# Patient Record
Sex: Female | Born: 1965 | Hispanic: Yes | Marital: Married | Smoking: Never smoker
Health system: Southern US, Community
[De-identification: ages and names within clinical notes are randomized; demographics above are authoritative.]

## PROBLEM LIST (undated history)

## (undated) DIAGNOSIS — I10 Essential (primary) hypertension: Secondary | ICD-10-CM

## (undated) DIAGNOSIS — M75111 Incomplete rotator cuff tear or rupture of right shoulder, not specified as traumatic: Secondary | ICD-10-CM

## (undated) HISTORY — DX: Essential (primary) hypertension: I10

---

## 2006-08-31 ENCOUNTER — Ambulatory Visit: Payer: Self-pay

## 2006-09-06 ENCOUNTER — Ambulatory Visit: Payer: Self-pay | Admitting: *Deleted

## 2006-12-16 ENCOUNTER — Ambulatory Visit: Payer: Self-pay | Admitting: Family Medicine

## 2007-07-27 ENCOUNTER — Ambulatory Visit: Payer: Self-pay | Admitting: Family Medicine

## 2007-10-03 ENCOUNTER — Ambulatory Visit: Payer: Self-pay | Admitting: *Deleted

## 2008-03-01 ENCOUNTER — Observation Stay: Payer: Self-pay

## 2008-04-11 ENCOUNTER — Inpatient Hospital Stay: Payer: Self-pay | Admitting: Obstetrics and Gynecology

## 2008-04-23 ENCOUNTER — Emergency Department: Payer: Self-pay | Admitting: Emergency Medicine

## 2009-12-25 ENCOUNTER — Ambulatory Visit: Payer: Self-pay

## 2010-01-12 ENCOUNTER — Ambulatory Visit: Payer: Self-pay

## 2010-06-02 ENCOUNTER — Ambulatory Visit: Payer: Self-pay

## 2011-06-08 ENCOUNTER — Ambulatory Visit: Payer: Self-pay

## 2011-07-01 ENCOUNTER — Ambulatory Visit: Payer: Self-pay

## 2011-07-01 LAB — URINALYSIS, COMPLETE
Nitrite: NEGATIVE
Ph: 6.5 (ref 4.5–8.0)
Protein: NEGATIVE

## 2011-07-02 LAB — URINE CULTURE

## 2011-07-30 ENCOUNTER — Ambulatory Visit: Payer: Self-pay | Admitting: Internal Medicine

## 2011-08-01 LAB — BETA STREP CULTURE(ARMC)

## 2012-06-14 ENCOUNTER — Ambulatory Visit: Payer: Self-pay

## 2014-06-13 ENCOUNTER — Ambulatory Visit: Payer: Self-pay

## 2015-12-19 ENCOUNTER — Encounter: Payer: Self-pay | Admitting: Physician Assistant

## 2015-12-19 ENCOUNTER — Ambulatory Visit: Payer: Self-pay | Admitting: Physician Assistant

## 2015-12-19 VITALS — BP 124/82 | HR 72 | Temp 98.8°F

## 2015-12-19 DIAGNOSIS — L259 Unspecified contact dermatitis, unspecified cause: Secondary | ICD-10-CM

## 2015-12-19 MED ORDER — HYDROCORTISONE VALERATE 0.2 % EX OINT: 1.0000 "application " | TOPICAL_OINTMENT | Freq: Two times a day (BID) | CUTANEOUS | Status: DC

## 2015-12-19 MED ORDER — HYDROXYZINE HCL 50 MG PO TABS: 50.0000 mg | ORAL_TABLET | Freq: Three times a day (TID) | ORAL | Status: DC | PRN

## 2015-12-19 MED ORDER — METHYLPREDNISOLONE 4 MG PO TBPK: ORAL_TABLET | ORAL | Status: DC

## 2015-12-19 NOTE — Progress Notes (Signed)
   Subjective:Rash    Patient ID: Rachel Hinton, female    DOB: Jul 26, 1965, 50 y.o.   MRN: [id number]  HPI Patient c/o rash anterior neck for 2 weeks. Denies wearing necklace or new personal hygiene products. States mild itching. No palliative measure for compliant.    Review of Systems Negative except for compliant.    Objective:   Physical Exam VSS. Macular lesion bilateral neck radiating to upper chest. No posterior lesion.no s/s of 2nd infection.       Assessment & Plan:Contact Dermatitis   Medrol dose pack, Westcort, and Atarax for one week.  Follow up one week if no improvement. Advised to take picture of Rash before starting medication.

## 2016-03-24 ENCOUNTER — Ambulatory Visit
Admission: RE | Admit: 2016-03-24 | Discharge: 2016-03-24 | Disposition: A | Discharge status: Home / Self Care | Payer: PRIVATE HEALTH INSURANCE | Source: Ambulatory Visit | Attending: Family | Admitting: Family

## 2016-03-24 ENCOUNTER — Ambulatory Visit: Payer: Self-pay | Admitting: Physician Assistant

## 2016-03-24 ENCOUNTER — Other Ambulatory Visit: Payer: Self-pay | Admitting: Family

## 2016-03-24 DIAGNOSIS — M25511 Pain in right shoulder: Secondary | ICD-10-CM | POA: Insufficient documentation

## 2016-03-24 DIAGNOSIS — R52 Pain, unspecified: Secondary | ICD-10-CM

## 2016-04-14 ENCOUNTER — Encounter: Payer: Self-pay | Admitting: Physical Therapy

## 2016-04-14 ENCOUNTER — Ambulatory Visit: Payer: PRIVATE HEALTH INSURANCE | Attending: Family | Admitting: Physical Therapy

## 2016-04-14 DIAGNOSIS — M62838 Other muscle spasm: Secondary | ICD-10-CM | POA: Insufficient documentation

## 2016-04-14 DIAGNOSIS — M6281 Muscle weakness (generalized): Secondary | ICD-10-CM | POA: Diagnosis present

## 2016-04-14 DIAGNOSIS — M25511 Pain in right shoulder: Secondary | ICD-10-CM | POA: Diagnosis present

## 2016-04-15 NOTE — Therapy (Addendum)
[facility] [address], [place], [address] Phone: [phone]   Fax:  [phone]  Physical Therapy Evaluation  Patient Details  Name: Rachel Hinton MRN: [id number] Date of Birth: 1966-02-25 Referring Provider: Kathrine Haddock MD  Encounter Date: 04/14/2016      PT End of Session - 04/14/16 0953    Visit Number 1   Number of Visits 6   Date for PT Re-Evaluation 05/27/16   Authorization Type 1   Authorization Time Period 6 WC   PT Start Time 6962   PT Stop Time 0950   PT Time Calculation (min) 58 min   Activity Tolerance Patient tolerated treatment well   Behavior During Therapy [facility] for tasks assessed/performed      History reviewed. No pertinent past medical history.  History reviewed. No pertinent surgical history.  There were no vitals filed for this visit.       Subjective Assessment - 04/14/16 0905    Subjective She reports currently she is improving with less right shoulder pain and able to perform more activities with less difficulty: Still having pain with reaching across to opposite shoulder, overhead, above shoulder level and behind back . She also reports that she has noticed increased swelling in her right shoulder/upper trapezius and chest area within the past couple of weeks, and increased pain with corner stretch which she was given by MD.    Pertinent History Patient reports she injured right shoulder with exercises in the past and she was seen by chiropractor at that thime and had injection and pain resolved completely.  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 She noticed that after several hours she was beginning to have problems with cleaning mirrors (above shoulder level) and turning on/off faucettes (reaching and rotation of  shoulder). The next day she continued with pain and she was seen by MD on that tuesday and was put in a sling, conservative care with medication, rest, light duty at work.    Limitations Lifting;House hold activities;Other (comment)  raising arm overhead, behind back (personal care, dressing, hair care)   Diagnostic tests X rays right shoulder   Patient Stated Goals To be able to use right arm without pain and back to normal activity   Currently in Pain? Yes   Pain Score 1    Pain Location Shoulder   Pain Orientation Right   Pain Descriptors / Indicators Discomfort;Dull   Pain Type Acute pain  03/21/2016   Pain Onset 1 to 4 weeks ago   Pain Frequency Intermittent   Aggravating Factors  reaching overhead, sleeping on right side, reaching behind back   Pain Relieving Factors hot showers, medication initially   Effect of Pain on Daily Activities limits hair care, personal care (undressing upper body), pushing and pulling, hygiene            [facility] PT Assessment - 04/14/16 0859      Assessment   Medical Diagnosis Right shoulder pain, strain   Referring Provider Kathrine Haddock MD   Onset Date/Surgical Date 03/21/16   Hand Dominance Right   Next MD Visit 04/20/2016   Prior Therapy none     Precautions   Precautions Shoulder   Type of Shoulder Precautions no lifting over 2#, no raising arm overhead for cleaning, no pushing/pulling (like  vacuuming)     Restrictions   Weight Bearing Restrictions No     Balance Screen   Has the patient fallen in the past 6 months No   Has the patient had a decrease in activity level because of a fear of falling?  No   Is the patient reluctant to leave their home because of a fear of falling?  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      Cognition   Overall Cognitive Status Within Functional Limits for tasks assessed     Objective: Posture: Forward rounded shoulders, right shoulder elevated as compared to left Observation: + mild swelling right anterior shoulder, inferior to clavicle AROM: Cervical spine: flexion, extension, rotations WFL without reproduction of symptoms Right shoulder: WFL's forward elevation, abduction and rotations with pain reported above shoulder level and behind back Left shoulder: all planes WNL's without pain Strength: grossly strength right and left UE major muscle groups strong without reproduction of symptoms Palpation; + spasms with tenderness along right shoulder anterior aspect inferior to clavicle along pectoral muscles and right upper trapezius muscle Special tests: + impingement sign right shoulder Outcome Measure: Quick Dash: 44% (moderate self perceived disability) ; work module 25% (mild self perceived disability: with assistance of Cabin crew)  Treatment: Manual therapy: STM performed superficial techniques to right shoulder, upper trapezius and pectoral muscles with patient in seated position Therapeutic exercise: Therapeutic exercise: patient performed exercises with verbal, tactile cues  demonstration of therapist: Sitting/standing: Scapular retractions at doorway with cervical retraction x 10 reps Pendulums for pain control  Patient response to treatment: decreased pain with improved flexibility in right shoulder s/p STM with decreased hiking right shoulder and improved scapular control, mobility; verbalized good understanding of home program        PT Education - 04/14/16 0945    Education provided Yes   Education Details HEP: posture awareness, scapular retraction, pendulums   Person(s) Educated Patient   Methods  Explanation;Demonstration;Verbal cues   Comprehension Verbalized understanding;Returned demonstration;Verbal cues required             PT Long Term Goals - 04/14/16 1050      PT LONG TERM GOAL #1   Title Patient will demonstrate improved function with right UE for daily tasks as indicated by QuickDash score of 20% or less by 05/27/2016   Baseline current 44%   Status New     PT LONG TERM GOAL #2   Title Patient will demonstrate full pain free AROM for overhead and behind right UE use for personal care and work related activities by 05/27/16   Baseline Limited pain free ROM right shoulder flexion and IR behind back    Status New     PT LONG TERM GOAL #3   Title Patient will be independent with home program for pain control, exercise for right UE to allow self management once discharged from physical therapy by 05/27/16   Baseline limited knowledge of appropriate exercises, pain control strategies and progression    Status New  Plan - 04/14/16 1050    Clinical Impression Statement Patient is a 50 year old right hand dominant female with right shoulder pain s/p injury sustained at work 03/21/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 She reports she is improving since injury and continues to have pain with reaching across trunk, above shoulder level and with reaching behind back. She has functional limitaitons with personal care, household cleaning that includes lifting, pushing activities. Her QuickDash score is 44% indicatiing moderate self perceived disability for using right UE for daily tasks and 25% self perceived disabiltiy for work related tasks because she has someone assisting her with duties currently. She has + swelling in right side of chest/inferior to clavicle and spasms in pectoral muscles. She has limited knowledge of pain control strategies and appropriate progression of exercise to return to PLOF and will therefore benefit from physical therapy  intervention.    Rehab Potential Good   Clinical Impairments Affecting Rehab Potential (+)motivated, acute condition, PLOF   PT Frequency --  1-2x/week   PT Duration Other (comment)   PT Treatment/Interventions Electrical Stimulation;Cryotherapy;Moist Heat;Ultrasound;Patient/family education;Neuromuscular re-education;Therapeutic exercise;Manual techniques;Passive range of motion;Dry needling   PT Next Visit Plan pain control, manual techniques for spasms, ROM, therapeutic exercises   PT Home Exercise Plan posture correction, scapular retraction, pendulums, use of heat/ice   Consulted and Agree with Plan of Care Patient      Patient will benefit from skilled therapeutic intervention in order to improve the following deficits and impairments:  Decreased strength, Impaired flexibility, Pain, Impaired perceived functional ability, Decreased activity tolerance, Increased muscle spasms, Impaired UE functional use, Decreased range of motion  Visit Diagnosis: Acute pain of right shoulder - Plan: PT plan of care cert/re-cert  Other muscle spasm - Plan: PT plan of care cert/re-cert  Muscle weakness (generalized) - Plan: PT plan of care cert/re-cert     Problem List There are no active problems to display for this patient.   Beacher MayBrooks, Cheick Suhr PT 04/15/2016, 3:47 PM  [facility] [address] [place], [place], [address] Phone: [phone]   Fax:  [phone]  Name: Sherrine Mapleslejandra Wadley MRN: [id number] Date of Birth: [birth year]

## 2016-04-21 ENCOUNTER — Encounter: Payer: Self-pay | Admitting: Physical Therapy

## 2016-04-21 ENCOUNTER — Ambulatory Visit: Payer: PRIVATE HEALTH INSURANCE | Admitting: Physical Therapy

## 2016-04-21 DIAGNOSIS — M25511 Pain in right shoulder: Secondary | ICD-10-CM | POA: Diagnosis not present

## 2016-04-21 DIAGNOSIS — M6281 Muscle weakness (generalized): Secondary | ICD-10-CM

## 2016-04-21 DIAGNOSIS — M62838 Other muscle spasm: Secondary | ICD-10-CM

## 2016-04-22 NOTE — Therapy (Signed)
[facility] [address] [place], [place], [address] Phone: [phone]   Fax:  [phone]  Physical Therapy Treatment  Patient Details  Name: Rachel Hinton MRN: [id number] Date of Birth: Jun 21, 1966 Referring Provider: Kathrine HaddockHunt, Mary Ruth MD  Encounter Date: 04/21/2016      PT End of Session - 04/21/16 1634    Visit Number 2   Number of Visits 6   Date for PT Re-Evaluation 05/27/16   Authorization Type 2   Authorization Time Period 6 WC   PT Start Time 1625   PT Stop Time 1710   PT Time Calculation (min) 45 min   Activity Tolerance Patient tolerated treatment well   Behavior During Therapy [facility] for tasks assessed/performed      History reviewed. No pertinent past medical history.  History reviewed. No pertinent surgical history.  There were no vitals filed for this visit.      Subjective Assessment - 04/21/16 1630    Subjective Patient reports she is much better with decreasing pain in right shoulder. She is still able to perform pulling activities with less pain/difficulty. Still anxious about pushing activities because she is afraid of causing shoulder pain especially with vacuuming.    Limitations Lifting;House hold activities;Other (comment)  raising arm overhead, pushing activities, personal care   Patient Stated Goals To be able to use right arm without pain and back to normal activity   Currently in Pain? No/denies  no pain: decribes symptoms as tired in right shoulder anterior aspect     Objective; Posture: improved with decreased hiking right shoulder, decreased swelling anterior aspect right shoulder near clavicle Palpation: + spasms along right upper trapezius, decreased soft tissue elasticity right pectoral muscles  Treatment: Manual therapy: STM performed superficial techniques to right shoulder, upper trapezius and pectoral muscles with patient in supine position Therapeutic exercise:  Therapeutic exercise: patient performed exercises with verbal, tactile cues  demonstration of therapist: supine lying:  AAROM right shoulder flexion/ER/IR x 5 reps through full ROM without increased pain; reported feeling stiff at end ranges Isometric flexion, rotations x 10 reps 5 second holds Scapular retraction x 10 Pectoral stretches with LTR to opposite side 3 x 20 seconds with assistance for guiding through full ROM Sitting/standing: Scapular retractions at doorway with cervical retraction x 10 reps Isometric shoulder flexion, extension, rotations performed following demonstration: 10 reps with 5 second holds each plane with VC for correct alignment  Modalities: moist heat applied to right shoulder with patient seated in chair with right UE supported on pillow 5-10 min at end of session for pain control following exercises: no adverse reaction noted   Patient response to treatment: Patient demonstrated improved technique with exercises with moderate repeated demonstration and VC for correct alignment of shoulder/trunk. Patient with decreased spasms by 50% following STM. Improved motor control with repetition        PT Education - 04/21/16 1710    Education provided Yes   Education Details HEP; added isometric exercises at wall/door frame for shoulder extension, flexion, rotations x 10 reps , 5 second holds, pectoral stretch in supine, rotate hips opposite side   Person(s) Educated Patient   Methods Explanation;Demonstration;Verbal cues;Handout   Comprehension Verbalized understanding;Returned demonstration;Verbal cues required             PT Long Term Goals - 04/14/16 1050      PT LONG TERM GOAL #1   Title Patient will demonstrate improved function with right UE for daily  tasks as indicated by QuickDash score of 20% or less by 05/27/2016   Baseline current 44%   Status New     PT LONG TERM GOAL #2   Title Patient will demonstrate full pain free AROM for overhead and  behind right UE use for personal care and work related activities by 05/27/16   Baseline Limited pain free ROM right shoulder flexion and IR behind back    Status New     PT LONG TERM GOAL #3   Title Patient will be independent with home program for pain control, exercise for right UE to allow self management once discharged from physical therapy by 05/27/16   Baseline limited knowledge of appropriate exercises, pain control strategies and progression    Status New               Plan - 04/21/16 1712    Clinical Impression Statement Patient is progressing with strengthening with isometrics today. She did not have any increased pain and demonstrated improved flexibility with pectoral stretches following STM. She continues with decreased strength/endurance for daily tasks and will benefit from additional physical therapy intervention to address limitations so she may be able to return to PLOF   Rehab Potential Good   PT Frequency --  1-2x/week   PT Duration Other (comment)  6 visits   PT Treatment/Interventions Electrical Stimulation;Cryotherapy;Moist Heat;Ultrasound;Patient/family education;Neuromuscular re-education;Therapeutic exercise;Manual techniques;Passive range of motion;Dry needling   PT Next Visit Plan pain control, manual techniques for spasms, ROM, therapeutic exercises   PT Home Exercise Plan posture correction, scapular retraction, pendulums, use of heat/ice      Patient will benefit from skilled therapeutic intervention in order to improve the following deficits and impairments:  Decreased strength, Impaired flexibility, Pain, Impaired perceived functional ability, Decreased activity tolerance, Increased muscle spasms, Impaired UE functional use, Decreased range of motion  Visit Diagnosis: Acute pain of right shoulder  Other muscle spasm  Muscle weakness (generalized)     Problem List There are no active problems to display for this patient.   Beacher May  PT 04/22/2016, 8:25 PM  [place] [facility] PHYSICAL AND SPORTS MEDICINE [address], [place], [address] Phone: [phone]   Fax:  [phone]  Name: Rachel Hinton MRN: [id number] Date of Birth: 1965/08/09

## 2016-04-23 ENCOUNTER — Encounter: Payer: Self-pay | Admitting: Physical Therapy

## 2016-04-23 ENCOUNTER — Ambulatory Visit: Payer: PRIVATE HEALTH INSURANCE | Admitting: Physical Therapy

## 2016-04-23 DIAGNOSIS — M25511 Pain in right shoulder: Secondary | ICD-10-CM

## 2016-04-23 DIAGNOSIS — M6281 Muscle weakness (generalized): Secondary | ICD-10-CM

## 2016-04-23 DIAGNOSIS — M62838 Other muscle spasm: Secondary | ICD-10-CM

## 2016-04-23 NOTE — Therapy (Signed)
[facility] [place] [facility] [address] [place], [place], [address] Phone: [phone]   Fax:  [phone]  Physical Therapy Treatment  Patient Details  Name: Rachel Hinton MRN: [id number] Date of Birth: 1965/07/22 Referring Provider: Kathrine HaddockHunt, Mary Ruth MD  Encounter Date: 04/23/2016      PT End of Session - 04/23/16 0847    Visit Number 3   Number of Visits 6   Date for PT Re-Evaluation 05/27/16   Authorization Type 3   Authorization Time Period 6 WC   PT Start Time 0843   PT Stop Time 0925   PT Time Calculation (min) 42 min   Activity Tolerance Patient tolerated treatment well   Behavior During Therapy [facility] for tasks assessed/performed      History reviewed. No pertinent past medical history.  History reviewed. No pertinent surgical history.  There were no vitals filed for this visit.      Subjective Assessment - 04/23/16 0845    Subjective Patient reports she is able to perform exercises given last session (isometrics) without pain in right shoulder.    Limitations Lifting;House hold activities;Other (comment)   Patient Stated Goals To be able to use right arm without pain and back to normal activity   Currently in Pain? No/denies      Objective; Posture: improved with decreased hiking right shoulder, decreased swelling anterior aspect right shoulder near clavicle Palpation: right pectoral muscle with mild spasm, + crepitus/grinding noted intermittently in [place] joint right shoulder with shoulder elevation and shoulder flexion  Treatment: Manual therapy: STM performed superficial techniques to right shoulder, upper trapezius and pectoral muscles with patient in supine position Therapeutic exercise: Therapeutic exercise: patient performed exercises with verbal, tactile cues demonstration of therapist: supine lying:  AAROM right shoulder flexion/ER/IR x 5 reps through full ROM without increased pain  Rhythmic  stabilization with Isometric flexion, rotations x 10 reps Pectoral stretches with LTR to opposite side 3 x 20 seconds with assistance for guiding through full ROM Side lying: Towel under arm: ER with 2# dumbbell x 10 reps Shoulder elevation/depression x 10 through partial elevation due to increased grinding/popping in [place] joint Scapular retraction x 5 (limited due to [facility] joint popping/grinding  Sitting/standing: Isometric shoulder flexion, extension, rotations performed x 10 reps with 5 second holds each plane   Modalities: moist heat applied to right shoulder with patient seated in chair with right UE supported on pillow 5-10 min at end of session for pain control following exercises: no adverse reaction noted  Patient response to treatment: patient demonstrated improved technique with exercises with minimal VC for correct alignment. Limited exercises due to increased popping and grinding in [place] joint intermittently with shoulder elevation and scapular retraction. None noted with isometric exercises.          PT Education - 04/23/16 0846    Education provided Yes   Education Details HEP: re assessed home program for isometric exercises and pectoral stretch, instructed to monitor symptoms in front of shoulder, [place] joint   Person(s) Educated Patient   Methods Explanation;Verbal cues   Comprehension Verbalized understanding;Returned demonstration;Verbal cues required             PT Long Term Goals - 04/14/16 1050      PT LONG TERM GOAL #1   Title Patient will demonstrate improved function with right UE for daily tasks as indicated by QuickDash score of 20% or less by 05/27/2016   Baseline current 44%   Status New  PT LONG TERM GOAL #2   Title Patient will demonstrate full pain free AROM for overhead and behind right UE use for personal care and work related activities by 05/27/16   Baseline Limited pain free ROM right shoulder flexion and IR behind back    Status New      PT LONG TERM GOAL #3   Title Patient will be independent with home program for pain control, exercise for right UE to allow self management once discharged from physical therapy by 05/27/16   Baseline limited knowledge of appropriate exercises, pain control strategies and progression    Status New               Plan - 04/23/16 0930    Clinical Impression Statement Patient demonstrates improving ROM and continues with discomfort anterior aspect of right shoulder girdle with grinding at [facility] joint which may be due to inflammation. This may improve with continued strengthening and soft tissue mobilization of pectoral and upper trapezius muslces. Will monitor symptoms for improvement.    Rehab Potential Good   PT Frequency Other (comment)  1-2x/week   PT Duration Other (comment)  6 visits   PT Treatment/Interventions Electrical Stimulation;Cryotherapy;Moist Heat;Ultrasound;Patient/family education;Neuromuscular re-education;Therapeutic exercise;Manual techniques;Passive range of motion;Dry needling   PT Next Visit Plan pain control, manual techniques for spasms, ROM, therapeutic exercises   PT Home Exercise Plan posture correction, scapular retraction, pendulums, use of heat/ice      Patient will benefit from skilled therapeutic intervention in order to improve the following deficits and impairments:  Decreased strength, Impaired flexibility, Pain, Impaired perceived functional ability, Decreased activity tolerance, Increased muscle spasms, Impaired UE functional use, Decreased range of motion  Visit Diagnosis: Acute pain of right shoulder  Other muscle spasm  Muscle weakness (generalized)     Problem List There are no active problems to display for this patient.   Beacher May PT 04/23/2016, 12:53 PM  [place] [facility] [address], [place], [address] Phone: [phone]   Fax:  [phone]  Name:  Rachel Hinton MRN: [id number] Date of Birth: 1965/09/11

## 2016-04-28 ENCOUNTER — Encounter: Payer: Self-pay | Admitting: Physical Therapy

## 2016-04-28 ENCOUNTER — Ambulatory Visit: Payer: PRIVATE HEALTH INSURANCE | Attending: Family | Admitting: Physical Therapy

## 2016-04-28 DIAGNOSIS — M62838 Other muscle spasm: Secondary | ICD-10-CM | POA: Diagnosis present

## 2016-04-28 DIAGNOSIS — M25511 Pain in right shoulder: Secondary | ICD-10-CM | POA: Insufficient documentation

## 2016-04-28 DIAGNOSIS — M6281 Muscle weakness (generalized): Secondary | ICD-10-CM | POA: Diagnosis present

## 2016-04-28 NOTE — Therapy (Signed)
[facility] [address] [place], [place], [address] Phone: [phone]   Fax:  [phone]  Physical Therapy Treatment  Patient Details  Name: Rachel Mapleslejandra Montanye MRN: [id number] Date of Birth: [birth year] Referring Provider: Kathrine HaddockHunt, Mary Ruth MD  Encounter Date: 04/28/2016      PT End of Session - 04/28/16 1924    Visit Number 4   Number of Visits 6   Date for PT Re-Evaluation 05/27/16   Authorization Type 4   Authorization Time Period 6 WC   PT Start Time 1831   PT Stop Time 1915   PT Time Calculation (min) 44 min   Activity Tolerance Patient tolerated treatment well   Behavior During Therapy [facility] for tasks assessed/performed      History reviewed. No pertinent past medical history.  History reviewed. No pertinent surgical history.  There were no vitals filed for this visit.      Subjective Assessment - 04/28/16 1832    Subjective Patient reports she is feeling better with less pain in right shoulder. She is still having some popping in [place] joint in front of chest. She is having a feeling some weakness in shoulder.She is now able to place right hand behind back with less difficulty and unhook bra with mild discomfort; she can put hair in ponytail now with mild discomfort now where she could not do it at all when she began PT.    Limitations Lifting;House hold activities;Other (comment)   Patient Stated Goals To be able to use right arm without pain and back to normal activity   Currently in Pain? Yes   Pain Score 2    Pain Location Shoulder   Pain Orientation Right;Anterior   Pain Descriptors / Indicators Discomfort   Pain Type Acute pain  03/21/2016   Pain Onset More than a month ago   Pain Frequency Intermittent      Objective; Posture: improved with decreased hiking right shoulder, decreased swelling anterior aspect right shoulder near clavicle Palpation: right pectoral muscle with mild spasm, + swelling  over right [place] joint as compared to left, + spasms SCM and scalenes right   Treatment: Manual therapy: STM performed superficial techniques to right shoulder, upper trapezius and pectoral muscles with patient in supine position; scalenes, SCM with patient seated Therapeutic exercise: Therapeutic exercise: patient performed exercises with verbal, tactile cues demonstration of therapist: supine lying:  AAROM right shoulder flexion/ER/IR x 5 reps through full ROM without increased pain  Rhythmic stabilization with Isometric flexion, rotations x 10 reps  Sitting/standing: Ball on wall for shoulder stabilization x 10 reps rotations and flexion [place]ene and upper trapezius stretch x 3 reps 10 second holds  Modalities: moist heat applied to right shoulder with patient seated in chair with right UE supported on pillow 5 min at end of session for pain control following exercises: no adverse reaction noted  Patient response to treatment: Patient demonstrated improved technique with exercises with minimal VC for correct alignment. Patient with mild increased  Soreness in right side chest/SCM. Patient with decreased spasms by 50% following STM.         PT Education - 04/28/16 1923    Education provided Yes   Education Details HEP: added scapular rows standing and shoulder extension standing with green tubing 10 reps 1x/day; stretch scalenes in sitting, gentle stretch   Person(s) Educated Patient   Methods Explanation;Demonstration;Verbal cues;Handout   Comprehension Verbalized understanding;Returned demonstration;Verbal cues required  PT Long Term Goals - 04/14/16 1050      PT LONG TERM GOAL #1   Title Patient will demonstrate improved function with right UE for daily tasks as indicated by QuickDash score of 20% or less by 05/27/2016   Baseline current 44%   Status New     PT LONG TERM GOAL #2   Title Patient will demonstrate full pain free AROM for overhead and behind  right UE use for personal care and work related activities by 05/27/16   Baseline Limited pain free ROM right shoulder flexion and IR behind back    Status New     PT LONG TERM GOAL #3   Title Patient will be independent with home program for pain control, exercise for right UE to allow self management once discharged from physical therapy by 05/27/16   Baseline limited knowledge of appropriate exercises, pain control strategies and progression    Status New               Plan - 04/28/16 1930    Clinical Impression Statement Patient with decreasing swelling, improving strength and control right hsoulder with current treatment. progressing toward all goals.    Rehab Potential Good   PT Frequency Other (comment)  1-2x/weel   PT Treatment/Interventions Electrical Stimulation;Cryotherapy;Moist Heat;Ultrasound;Patient/family education;Neuromuscular re-education;Therapeutic exercise;Manual techniques;Passive range of motion;Dry needling   PT Next Visit Plan pain control, manual techniques for spasms, ROM, therapeutic exercises   PT Home Exercise Plan posture correction, scapular retraction, pendulums, use of heat/ice      Patient will benefit from skilled therapeutic intervention in order to improve the following deficits and impairments:  Decreased strength, Impaired flexibility, Pain, Impaired perceived functional ability, Decreased activity tolerance, Increased muscle spasms, Impaired UE functional use, Decreased range of motion  Visit Diagnosis: Acute pain of right shoulder  Other muscle spasm     Problem List There are no active problems to display for this patient.   Beacher MayBrooks, Jordyn Hofacker PT 04/29/2016, 8:37 PM  [facility] [address] [place], [place], [address] Phone: [phone]   Fax:  [phone]  Name: Rachel Mapleslejandra Poyser MRN: [id number] Date of Birth: [birth year]

## 2016-05-04 ENCOUNTER — Ambulatory Visit: Payer: PRIVATE HEALTH INSURANCE | Admitting: Physical Therapy

## 2016-05-04 ENCOUNTER — Encounter: Payer: Self-pay | Admitting: Physical Therapy

## 2016-05-04 DIAGNOSIS — M6281 Muscle weakness (generalized): Secondary | ICD-10-CM

## 2016-05-04 DIAGNOSIS — M62838 Other muscle spasm: Secondary | ICD-10-CM

## 2016-05-04 DIAGNOSIS — M25511 Pain in right shoulder: Secondary | ICD-10-CM | POA: Diagnosis not present

## 2016-05-04 NOTE — Therapy (Signed)
[facility] [address] [place], [place], [address] Phone: [phone]   Fax:  [phone]  Physical Therapy Treatment  Patient Details  Name: Rachel Hinton MRN: [id number] Date of Birth: 1966/01/29 Referring Provider: Kathrine HaddockHunt, Mary Ruth MD; Edmonia Jamesommie Ann Moore, NP  Encounter Date: 05/04/2016      PT End of Session - 05/04/16 1957    Visit Number 5   Number of Visits 6   Date for PT Re-Evaluation 05/27/16   Authorization Type 5   Authorization Time Period 6 WC   PT Start Time 1903   PT Stop Time 1945   PT Time Calculation (min) 42 min   Activity Tolerance Patient tolerated treatment well   Behavior During Therapy [facility] for tasks assessed/performed      History reviewed. No pertinent past medical history.  History reviewed. No pertinent surgical history.  There were no vitals filed for this visit.      Subjective Assessment - 05/04/16 1909    Subjective Since last visit patient reports she is doing more at work with right UE because she is getting back to more normal duties and is now feeling increased burning into lateral upper arm and in front of chest is having tinging/biting sensation. She feels heaviness, weakness in right shoulder.  She is noticing the swelling is coming down in front of chest as well. She is concerned with prominence of joint in front of chest ([place] joint) and cannot perform all duties at work due to right shoulder/ chest discomfort/pain.   Limitations Lifting;House hold activities;Other (comment)   Patient Stated Goals To be able to use right arm without pain and back to normal activity   Currently in Pain? Yes   Pain Score 3    Pain Location Arm   Pain Orientation Right;Lateral   Pain Descriptors / Indicators Burning;Discomfort   Pain Type Acute pain  03/21/2016   Pain Onset More than a month ago      Objective: Posture/observation; + mild swelling with more prominent [place] joint right  than left AAROM: right shoulder full ROM flexion, rotations, abduction without increased pain Strength; right shoulder flexion, rotations, abduction 4/5 with increased anterior shoulder, [place] joint discomfort right shoulder with resisted forward flexion Palpation; + spasms along right pectoral muscles, + more prominent right [place] joint (needs further evaluation for possible sprain)   Treatment: Manual therapy: STM right shoulder anterior shoulder, pectoral muscles, superficial techniques with patient supine Therapeutic exercise: performed with verbal, tactile cues and demonstration of therapist: Supine lying:  AAROM through full ROM right shoulder flexion, ER, IR abduction and pectoral stretch 5 reps each Isometric flexion with elbow flexed 90 degrees x 3 reps 2 sets (pre and post US) Modalities: Ultrasound 1MHz pulsed 20% 1.3w/cm2 x 10 min. Right pectoral region, [place] joint with patient supine lying   Patient response to treatment: decreased prominence of [place] joint with decreased discomfort with resisted flexion right shoulder following US, decreased burning into right shoulder following treatment to no symptoms of burning.       PT Education - 05/04/16 2023    Education provided Yes   Education Details HEP: hold off on exercises until follow up assessment tomorrow   Person(s) Educated Patient   Methods Explanation   Comprehension Verbalized understanding             PT Long Term Goals - 04/14/16 1050      PT LONG TERM GOAL #1   Title Patient  will demonstrate improved function with right UE for daily tasks as indicated by QuickDash score of 20% or less by 05/27/2016   Baseline current 44%   Status New     PT LONG TERM GOAL #2   Title Patient will demonstrate full pain free AROM for overhead and behind right UE use for personal care and work related activities by 05/27/16   Baseline Limited pain free ROM right shoulder flexion and IR behind back    Status New     PT LONG TERM  GOAL #3   Title Patient will be independent with home program for pain control, exercise for right UE to allow self management once discharged from physical therapy by 05/27/16   Baseline limited knowledge of appropriate exercises, pain control strategies and progression    Status New               Plan - 05/04/16 1958    Clinical Impression Statement Patient has improved with ROM to full pain free right shoulder flexion, extension, IR and ER. She continues with anterior chest swelling and sterno clavicular joint more prominent on right side as compared to left with increased pain with isometric flexion/pushing motion right UE. Recommend further diagnostic assessment [place] joint to determine amount of injury/sprain due to continued swelling, pain, burning sensations in chest/right shoulder/upper arm with increased activity.    Rehab Potential Good   PT Frequency Other (comment)  1-2x/week   PT Duration Other (comment)  6 visits   PT Treatment/Interventions Electrical Stimulation;Cryotherapy;Moist Heat;Ultrasound;Patient/family education;Neuromuscular re-education;Therapeutic exercise;Manual techniques;Passive range of motion;Dry needling   PT Next Visit Plan pain control, manual techniques for spasms, ROM, therapeutic exercises   PT Home Exercise Plan posture correction, scapular retraction, pendulums, use of heat/ice      Patient will benefit from skilled therapeutic intervention in order to improve the following deficits and impairments:  Decreased strength, Impaired flexibility, Pain, Impaired perceived functional ability, Decreased activity tolerance, Increased muscle spasms, Impaired UE functional use, Decreased range of motion  Visit Diagnosis: Acute pain of right shoulder  Other muscle spasm  Muscle weakness (generalized)     Problem List There are no active problems to display for this patient.   Beacher MayBrooks, Marie PT 05/04/2016, 8:24 PM  [facility] [address] [place], [place], [address] Phone: [phone]   Fax:  [phone]  Name: Rachel Hinton MRN: [id number] Date of Birth: September 10, 1965

## 2016-05-06 ENCOUNTER — Encounter: Payer: PRIVATE HEALTH INSURANCE | Admitting: Physical Therapy

## 2016-05-13 ENCOUNTER — Encounter: Payer: Self-pay | Admitting: Physical Therapy

## 2016-05-13 ENCOUNTER — Ambulatory Visit: Payer: PRIVATE HEALTH INSURANCE | Admitting: Physical Therapy

## 2016-05-13 DIAGNOSIS — M25511 Pain in right shoulder: Secondary | ICD-10-CM

## 2016-05-13 DIAGNOSIS — M62838 Other muscle spasm: Secondary | ICD-10-CM

## 2016-05-13 DIAGNOSIS — M6281 Muscle weakness (generalized): Secondary | ICD-10-CM

## 2016-05-14 NOTE — Therapy (Signed)
[facility] [address] [place], [place], [address] Phone: [phone]   Fax:  [phone]  Physical Therapy Treatment  Patient Details  Name: Rachel Hinton MRN: [id number] Date of Birth: 11-Apr-1966 Referring Provider: Kathrine HaddockHunt, Mary Ruth MD  Encounter Date: 05/13/2016      PT End of Session - 05/13/16 1538    Visit Number 6   Number of Visits 6   Date for PT Re-Evaluation 05/27/16   Authorization Type 6   Authorization Time Period 6 WC   PT Start Time 1531   PT Stop Time 1613   PT Time Calculation (min) 42 min   Activity Tolerance Patient tolerated treatment well   Behavior During Therapy [facility] for tasks assessed/performed      History reviewed. No pertinent past medical history.  History reviewed. No pertinent surgical history.  There were no vitals filed for this visit.      Subjective Assessment - 05/13/16 1533    Subjective Patient reports she continues with intermittent symptoms of burning into right anterior shoulder and biting sensation around [place] joint. She is still on restrictions for work with no pushing, no lifting.  She is scheduled to have an orthopedic consult next week and will find out if she requires further therapy or diagnotic tests.    Limitations Lifting;House hold activities;Other (comment)   Patient Stated Goals To be able to use right arm without pain and back to normal activity   Currently in Pain? No/denies      Objective: Posture/observation; right shoulder/anterior chest mild swelling with more prominent [place] joint  AAROM: right shoulder full ROM flexion, rotations, abduction without increased pain Strength; right shoulder flexion, rotations, abduction 4/5 with increased anterior shoulder, [place] joint discomfort right shoulder with resisted forward flexion Palpation; + spasms along right pectoral muscles, SCM, cervical paraspinal muscles and upper trapezius, more prominent right [place]  joint (needs further evaluation for possible sprain)   Treatment: Manual therapy: STM right shoulder anterior shoulder, pectoral muscles, SCM and cervical spine paraspinals, superficial techniques with patient supine, followed by ROM exercises Therapeutic exercise: performed with verbal, tactile cues and demonstration of therapist: Supine lying:  AAROM through full ROM right shoulder flexion, ER, IR abduction and pectoral stretch 5 reps each Modalities: Ice pack applied to right shoulder/[place] joint with patient supine lying with pillow supporting right UE x 10 min. At end of session (unbilled)  Patient response to treatment: No increased pain with ROM exercises, decreased spasms noted with STM, [place] joint remained prominent as compared to left joint.         PT Education - 05/13/16 1536    Education provided Yes   Education Details HEP: positioning for sleep using cervical roll; continue with ROM and postureal exercises, pain free.    Person(s) Educated Patient   Methods Explanation   Comprehension Verbalized understanding             PT Long Term Goals - 04/14/16 1050      PT LONG TERM GOAL #1   Title Patient will demonstrate improved function with right UE for daily tasks as indicated by QuickDash score of 20% or less by 05/27/2016   Baseline current 44%   Status New     PT LONG TERM GOAL #2   Title Patient will demonstrate full pain free AROM for overhead and behind right UE use for personal care and work related activities by 05/27/16   Baseline Limited pain free ROM right  shoulder flexion and IR behind back    Status New     PT LONG TERM GOAL #3   Title Patient will be independent with home program for pain control, exercise for right UE to allow self management once discharged from physical therapy by 05/27/16   Baseline limited knowledge of appropriate exercises, pain control strategies and progression    Status New               Plan - 05/13/16 1539     Clinical Impression Statement Patient continues with pain, swelling in right shoulder, chest, [place] joint that limits function with daily tasks, personal care and work related activities. She feels better with resting and using ice or heat, depending on the day.  She is going for orthopedic evaluation to determine further course of treatment.    Rehab Potential Good   PT Frequency Other (comment)  1-2x/week    PT Duration Other (comment)  6 visits   PT Treatment/Interventions Electrical Stimulation;Cryotherapy;Moist Heat;Ultrasound;Patient/family education;Neuromuscular re-education;Therapeutic exercise;Manual techniques;Passive range of motion;Dry needling   PT Next Visit Plan pain control, manual techniques for spasms, ROM, therapeutic exercises   PT Home Exercise Plan posture correction, scapular retraction, pendulums, use of heat/ice      Patient will benefit from skilled therapeutic intervention in order to improve the following deficits and impairments:  Decreased strength, Impaired flexibility, Pain, Impaired perceived functional ability, Decreased activity tolerance, Increased muscle spasms, Impaired UE functional use, Decreased range of motion  Visit Diagnosis: Acute pain of right shoulder  Other muscle spasm  Muscle weakness (generalized)     Problem List There are no active problems to display for this patient.   Beacher MayBrooks, Vincent Ehrler PT 05/14/2016, 9:19 PM  [place] [facility] [address] [place], [place], [address] Phone: [phone]   Fax:  [phone]  Name: Rachel Hinton MRN: [id number] Date of Birth: 11-22-1965

## 2016-05-17 ENCOUNTER — Ambulatory Visit: Payer: Self-pay | Admitting: Physical Therapy

## 2016-05-26 ENCOUNTER — Encounter: Payer: Self-pay | Admitting: Physical Therapy

## 2016-06-14 ENCOUNTER — Other Ambulatory Visit: Payer: Self-pay | Admitting: Obstetrics & Gynecology

## 2016-06-14 DIAGNOSIS — M25511 Pain in right shoulder: Secondary | ICD-10-CM

## 2016-06-25 ENCOUNTER — Ambulatory Visit
Admission: RE | Admit: 2016-06-25 | Discharge: 2016-06-25 | Disposition: A | Discharge status: Home / Self Care | Payer: PRIVATE HEALTH INSURANCE | Source: Ambulatory Visit | Attending: Obstetrics & Gynecology | Admitting: Obstetrics & Gynecology

## 2016-06-25 ENCOUNTER — Encounter: Payer: Self-pay | Admitting: Radiology

## 2016-06-25 DIAGNOSIS — M7551 Bursitis of right shoulder: Secondary | ICD-10-CM | POA: Diagnosis not present

## 2016-06-25 DIAGNOSIS — M25511 Pain in right shoulder: Secondary | ICD-10-CM

## 2016-06-25 DIAGNOSIS — M75101 Unspecified rotator cuff tear or rupture of right shoulder, not specified as traumatic: Secondary | ICD-10-CM | POA: Insufficient documentation

## 2016-06-25 DIAGNOSIS — M67911 Unspecified disorder of synovium and tendon, right shoulder: Secondary | ICD-10-CM | POA: Diagnosis present

## 2016-07-13 ENCOUNTER — Encounter (HOSPITAL_BASED_OUTPATIENT_CLINIC_OR_DEPARTMENT_OTHER): Payer: Self-pay | Admitting: *Deleted

## 2016-07-16 ENCOUNTER — Other Ambulatory Visit: Payer: Self-pay | Admitting: Orthopedic Surgery

## 2016-07-19 ENCOUNTER — Encounter (HOSPITAL_BASED_OUTPATIENT_CLINIC_OR_DEPARTMENT_OTHER): Payer: Self-pay | Admitting: *Deleted

## 2016-07-19 ENCOUNTER — Ambulatory Visit (HOSPITAL_BASED_OUTPATIENT_CLINIC_OR_DEPARTMENT_OTHER): Payer: PRIVATE HEALTH INSURANCE | Admitting: Anesthesiology

## 2016-07-19 ENCOUNTER — Encounter (HOSPITAL_BASED_OUTPATIENT_CLINIC_OR_DEPARTMENT_OTHER)
Admission: RE | Disposition: A | Discharge status: Home / Self Care | Payer: Self-pay | Source: Ambulatory Visit | Attending: Orthopedic Surgery

## 2016-07-19 ENCOUNTER — Ambulatory Visit (HOSPITAL_BASED_OUTPATIENT_CLINIC_OR_DEPARTMENT_OTHER)
Admission: RE | Admit: 2016-07-19 | Discharge: 2016-07-19 | Disposition: A | Discharge status: Home / Self Care | Payer: PRIVATE HEALTH INSURANCE | Source: Ambulatory Visit | Attending: Orthopedic Surgery | Admitting: Orthopedic Surgery

## 2016-07-19 DIAGNOSIS — M75111 Incomplete rotator cuff tear or rupture of right shoulder, not specified as traumatic: Secondary | ICD-10-CM | POA: Diagnosis not present

## 2016-07-19 DIAGNOSIS — Y990 Civilian activity done for income or pay: Secondary | ICD-10-CM | POA: Diagnosis not present

## 2016-07-19 DIAGNOSIS — M249 Joint derangement, unspecified: Secondary | ICD-10-CM | POA: Insufficient documentation

## 2016-07-19 DIAGNOSIS — S43491A Other sprain of right shoulder joint, initial encounter: Secondary | ICD-10-CM | POA: Insufficient documentation

## 2016-07-19 DIAGNOSIS — M75101 Unspecified rotator cuff tear or rupture of right shoulder, not specified as traumatic: Secondary | ICD-10-CM | POA: Diagnosis not present

## 2016-07-19 DIAGNOSIS — M19011 Primary osteoarthritis, right shoulder: Secondary | ICD-10-CM | POA: Diagnosis not present

## 2016-07-19 DIAGNOSIS — G8918 Other acute postprocedural pain: Secondary | ICD-10-CM | POA: Diagnosis not present

## 2016-07-19 DIAGNOSIS — M25511 Pain in right shoulder: Secondary | ICD-10-CM | POA: Diagnosis present

## 2016-07-19 HISTORY — PX: SUBACROMIAL DECOMPRESSION: SHX5174

## 2016-07-19 HISTORY — PX: SHOULDER ARTHROSCOPY WITH DISTAL CLAVICLE RESECTION: SHX5675

## 2016-07-19 HISTORY — DX: Incomplete rotator cuff tear or rupture of right shoulder, not specified as traumatic: M75.111

## 2016-07-19 SURGERY — SHOULDER ARTHROSCOPY WITH DISTAL CLAVICLE RESECTION
Anesthesia: Regional | Site: Shoulder | Laterality: Right

## 2016-07-19 MED ORDER — OXYCODONE-ACETAMINOPHEN 5-325 MG PO TABS: 1.0000 | ORAL_TABLET | ORAL | 0 refills | Status: DC | PRN

## 2016-07-19 MED ORDER — MIDAZOLAM HCL 2 MG/2ML IJ SOLN
1.0000 mg | INTRAMUSCULAR | Status: DC | PRN
  Administered 2016-07-19: 2 mg via INTRAVENOUS

## 2016-07-19 MED ORDER — OXYCODONE HCL 5 MG PO TABS: 5.0000 mg | ORAL_TABLET | Freq: Once | ORAL | Status: DC | PRN

## 2016-07-19 MED ORDER — SCOPOLAMINE 1 MG/3DAYS TD PT72: 1.0000 | MEDICATED_PATCH | Freq: Once | TRANSDERMAL | Status: DC | PRN

## 2016-07-19 MED ORDER — SUCCINYLCHOLINE CHLORIDE 20 MG/ML IJ SOLN
INTRAMUSCULAR | Status: DC | PRN
  Administered 2016-07-19: 80 mg via INTRAVENOUS

## 2016-07-19 MED ORDER — DOCUSATE SODIUM 100 MG PO CAPS: 100.0000 mg | ORAL_CAPSULE | Freq: Three times a day (TID) | ORAL | 0 refills | Status: DC | PRN

## 2016-07-19 MED ORDER — LIDOCAINE HCL (CARDIAC) 20 MG/ML IV SOLN
INTRAVENOUS | Status: DC | PRN
  Administered 2016-07-19: 100 mg via INTRAVENOUS

## 2016-07-19 MED ORDER — DEXAMETHASONE SODIUM PHOSPHATE 4 MG/ML IJ SOLN
INTRAMUSCULAR | Status: DC | PRN
  Administered 2016-07-19: 10 mg via INTRAVENOUS

## 2016-07-19 MED ORDER — ONDANSETRON HCL 4 MG/2ML IJ SOLN
INTRAMUSCULAR | Status: DC | PRN
Start: 2016-07-19 — End: 2016-07-19
  Administered 2016-07-19: 4 mg via INTRAVENOUS

## 2016-07-19 MED ORDER — POVIDONE-IODINE 7.5 % EX SOLN: Freq: Once | CUTANEOUS | Status: DC

## 2016-07-19 MED ORDER — BUPIVACAINE-EPINEPHRINE (PF) 0.5% -1:200000 IJ SOLN
INTRAMUSCULAR | Status: DC | PRN
  Administered 2016-07-19: 30 mL via PERINEURAL

## 2016-07-19 MED ORDER — HYDROMORPHONE HCL 1 MG/ML IJ SOLN: 0.2500 mg | INTRAMUSCULAR | Status: DC | PRN

## 2016-07-19 MED ORDER — OXYCODONE HCL 5 MG/5ML PO SOLN: 5.0000 mg | Freq: Once | ORAL | Status: DC | PRN

## 2016-07-19 MED ORDER — MIDAZOLAM HCL 2 MG/2ML IJ SOLN
INTRAMUSCULAR | Status: AC
  Filled 2016-07-19: qty 2

## 2016-07-19 MED ORDER — LACTATED RINGERS IV SOLN
INTRAVENOUS | Status: DC
  Administered 2016-07-19 (×2): via INTRAVENOUS

## 2016-07-19 MED ORDER — PROMETHAZINE HCL 25 MG/ML IJ SOLN: 6.2500 mg | INTRAMUSCULAR | Status: DC | PRN

## 2016-07-19 MED ORDER — CEFAZOLIN SODIUM-DEXTROSE 2-4 GM/100ML-% IV SOLN
2.0000 g | INTRAVENOUS | Status: AC
  Administered 2016-07-19: 2 g via INTRAVENOUS

## 2016-07-19 MED ORDER — FENTANYL CITRATE (PF) 100 MCG/2ML IJ SOLN
50.0000 ug | INTRAMUSCULAR | Status: DC | PRN
  Administered 2016-07-19: 100 ug via INTRAVENOUS

## 2016-07-19 MED ORDER — CEFAZOLIN SODIUM-DEXTROSE 2-4 GM/100ML-% IV SOLN
INTRAVENOUS | Status: AC
  Filled 2016-07-19: qty 100

## 2016-07-19 MED ORDER — FENTANYL CITRATE (PF) 100 MCG/2ML IJ SOLN
INTRAMUSCULAR | Status: AC
  Filled 2016-07-19: qty 2

## 2016-07-19 MED ORDER — MEPERIDINE HCL 25 MG/ML IJ SOLN: 6.2500 mg | INTRAMUSCULAR | Status: DC | PRN

## 2016-07-19 MED ORDER — PROPOFOL 10 MG/ML IV BOLUS
INTRAVENOUS | Status: DC | PRN
  Administered 2016-07-19: 150 mg via INTRAVENOUS
  Administered 2016-07-19: 50 mg via INTRAVENOUS

## 2016-07-19 SURGICAL SUPPLY — 81 items
BENZOIN TINCTURE PRP APPL 2/3 (GAUZE/BANDAGES/DRESSINGS) IMPLANT
BLADE CLIPPER SURG (BLADE) IMPLANT
BLADE SURG 15 STRL LF DISP TIS (BLADE) IMPLANT
BLADE SURG 15 STRL SS (BLADE)
BUR OVAL 4.0 (BURR) ×4 IMPLANT
CANNULA 5.75X71 LONG (CANNULA) ×4 IMPLANT
CANNULA TWIST IN 8.25X7CM (CANNULA) IMPLANT
CHLORAPREP W/TINT 26ML (MISCELLANEOUS) ×4 IMPLANT
CLOSURE WOUND 1/2 X4 (GAUZE/BANDAGES/DRESSINGS)
DECANTER SPIKE VIAL GLASS SM (MISCELLANEOUS) IMPLANT
DERMABOND ADVANCED (GAUZE/BANDAGES/DRESSINGS)
DERMABOND ADVANCED .7 DNX12 (GAUZE/BANDAGES/DRESSINGS) IMPLANT
DRAPE IMP U-DRAPE 54X76 (DRAPES) ×4 IMPLANT
DRAPE INCISE IOBAN 66X45 STRL (DRAPES) ×4 IMPLANT
DRAPE STERI 35X30 U-POUCH (DRAPES) ×4 IMPLANT
DRAPE SURG 17X23 STRL (DRAPES) ×4 IMPLANT
DRAPE U-SHAPE 47X51 STRL (DRAPES) ×4 IMPLANT
DRAPE U-SHAPE 76X120 STRL (DRAPES) ×8 IMPLANT
DRSG PAD ABDOMINAL 8X10 ST (GAUZE/BANDAGES/DRESSINGS) ×4 IMPLANT
ELECT REM PT RETURN 9FT ADLT (ELECTROSURGICAL) ×4
ELECTRODE REM PT RTRN 9FT ADLT (ELECTROSURGICAL) ×2 IMPLANT
GAUZE SPONGE 4X4 12PLY STRL (GAUZE/BANDAGES/DRESSINGS) ×4 IMPLANT
GAUZE SPONGE 4X4 16PLY XRAY LF (GAUZE/BANDAGES/DRESSINGS) IMPLANT
GAUZE XEROFORM 1X8 LF (GAUZE/BANDAGES/DRESSINGS) ×4 IMPLANT
GLOVE BIO SURGEON STRL SZ7 (GLOVE) ×4 IMPLANT
GLOVE BIO SURGEON STRL SZ7.5 (GLOVE) ×4 IMPLANT
GLOVE BIOGEL PI IND STRL 7.0 (GLOVE) ×6 IMPLANT
GLOVE BIOGEL PI IND STRL 8 (GLOVE) ×2 IMPLANT
GLOVE BIOGEL PI INDICATOR 7.0 (GLOVE) ×6
GLOVE BIOGEL PI INDICATOR 8 (GLOVE) ×2
GLOVE ECLIPSE 6.5 STRL STRAW (GLOVE) ×4 IMPLANT
GOWN STRL REUS W/ TWL LRG LVL3 (GOWN DISPOSABLE) ×4 IMPLANT
GOWN STRL REUS W/ TWL XL LVL3 (GOWN DISPOSABLE) ×2 IMPLANT
GOWN STRL REUS W/TWL LRG LVL3 (GOWN DISPOSABLE) ×4
GOWN STRL REUS W/TWL XL LVL3 (GOWN DISPOSABLE) ×2
LASSO CRESCENT QUICKPASS (SUTURE) IMPLANT
MANIFOLD NEPTUNE II (INSTRUMENTS) ×4 IMPLANT
NDL SUT 6 .5 CRC .975X.05 MAYO (NEEDLE) IMPLANT
NEEDLE 1/2 CIR CATGUT .05X1.09 (NEEDLE) IMPLANT
NEEDLE MAYO TAPER (NEEDLE)
NEEDLE SCORPION MULTI FIRE (NEEDLE) IMPLANT
NS IRRIG 1000ML POUR BTL (IV SOLUTION) IMPLANT
PACK ARTHROSCOPY DSU (CUSTOM PROCEDURE TRAY) ×4 IMPLANT
PACK BASIN DAY SURGERY FS (CUSTOM PROCEDURE TRAY) ×4 IMPLANT
PENCIL BUTTON HOLSTER BLD 10FT (ELECTRODE) IMPLANT
PROBE BIPOLAR ATHRO 135MM 90D (MISCELLANEOUS) ×4 IMPLANT
RESECTOR FULL RADIUS 4.2MM (BLADE) ×4 IMPLANT
RESTRAINT HEAD UNIVERSAL NS (MISCELLANEOUS) ×4 IMPLANT
SHEET MEDIUM DRAPE 40X70 STRL (DRAPES) IMPLANT
SLEEVE SCD COMPRESS KNEE MED (MISCELLANEOUS) ×4 IMPLANT
SLING ARM FOAM STRAP LRG (SOFTGOODS) IMPLANT
SLING ARM IMMOBILIZER MED (SOFTGOODS) IMPLANT
SLING ARM MED ADULT FOAM STRAP (SOFTGOODS) IMPLANT
SLING ARM XL FOAM STRAP (SOFTGOODS) IMPLANT
SPONGE LAP 4X18 X RAY DECT (DISPOSABLE) IMPLANT
STRIP CLOSURE SKIN 1/2X4 (GAUZE/BANDAGES/DRESSINGS) IMPLANT
SUCTION FRAZIER HANDLE 10FR (MISCELLANEOUS)
SUCTION TUBE FRAZIER 10FR DISP (MISCELLANEOUS) IMPLANT
SUPPORT WRAP ARM LG (MISCELLANEOUS) IMPLANT
SUT BONE WAX W31G (SUTURE) IMPLANT
SUT ETHILON 3 0 PS 1 (SUTURE) ×4 IMPLANT
SUT ETHILON 4 0 PS 2 18 (SUTURE) IMPLANT
SUT FIBERWIRE #2 38 T-5 BLUE (SUTURE)
SUT MNCRL AB 4-0 PS2 18 (SUTURE) IMPLANT
SUT PDS AB 0 CT 36 (SUTURE) IMPLANT
SUT PROLENE 3 0 PS 2 (SUTURE) IMPLANT
SUT TIGER TAPE 7 IN WHITE (SUTURE) IMPLANT
SUT VIC AB 0 CT1 27 (SUTURE)
SUT VIC AB 0 CT1 27XBRD ANBCTR (SUTURE) IMPLANT
SUT VIC AB 2-0 SH 27 (SUTURE)
SUT VIC AB 2-0 SH 27XBRD (SUTURE) IMPLANT
SUTURE FIBERWR #2 38 T-5 BLUE (SUTURE) IMPLANT
SYR BULB 3OZ (MISCELLANEOUS) IMPLANT
TAPE FIBER 2MM 7IN #2 BLUE (SUTURE) IMPLANT
TOWEL OR 17X24 6PK STRL BLUE (TOWEL DISPOSABLE) ×4 IMPLANT
TOWEL OR NON WOVEN STRL DISP B (DISPOSABLE) ×4 IMPLANT
TUBE CONNECTING 20'X1/4 (TUBING) ×1
TUBE CONNECTING 20X1/4 (TUBING) ×3 IMPLANT
TUBING ARTHROSCOPY IRRIG 16FT (MISCELLANEOUS) ×4 IMPLANT
WATER STERILE IRR 1000ML POUR (IV SOLUTION) ×4 IMPLANT
YANKAUER SUCT BULB TIP NO VENT (SUCTIONS) IMPLANT

## 2016-07-19 NOTE — Anesthesia Procedure Notes (Addendum)
Anesthesia Regional Block:  Interscalene brachial plexus block  Pre-Anesthetic Checklist: ,, timeout performed, Correct Patient, Correct Site, Correct Laterality, Correct Procedure, Correct Position, site marked, Risks and benefits discussed,  Surgical consent,  Pre-op evaluation,  At surgeon's request and post-op pain management  Laterality: Right  Prep: chloraprep       Needles:  Injection technique: Single-shot  Needle Type: Stimulator Needle - 40     Needle Length: 4cm 4 cm Needle Gauge: 22 and 22 G    Additional Needles:  Procedures: ultrasound guided (picture in chart) and nerve stimulator Interscalene brachial plexus block  Nerve Stimulator or Paresthesia:  Response: Deltoid, 0.5 mA,   Additional Responses:   Narrative:  Start time: 07/19/2016 10:45 AM End time: 07/19/2016 10:50 AM Injection made incrementally with aspirations every 5 mL. Anesthesiologist: Lewie LoronGERMEROTH, Quiana Cobaugh  Additional Notes: BP cuff, EKG monitors applied. Sedation begun. Nerve location verified with U/S. Anesthetic injected incrementally, slowly , and after neg aspirations under direct u/s guidance. Good perineural spread. Tolerated well.

## 2016-07-19 NOTE — Anesthesia Postprocedure Evaluation (Signed)
Anesthesia Post Note  Patient: Sherrine Mapleslejandra Dumond  Procedure(s) Performed: Procedure(s) (LRB): SHOULDER ARTHROSCOPY, DEBRIDEMENT PARTIAL ROTATOR CUFF TEAR, SUBACROMIAL DECOMPRESSION, DISTAL CLAVICAL EXCISION (Right) SUBACROMIAL DECOMPRESSION (Right)  Patient location during evaluation: PACU Anesthesia Type: Regional and General Level of consciousness: sedated and patient cooperative Pain management: pain level controlled Vital Signs Assessment: post-procedure vital signs reviewed and stable Respiratory status: spontaneous breathing Cardiovascular status: stable Anesthetic complications: no       Last Vitals:  Vitals:   07/19/16 1330 07/19/16 1409  BP: 126/80 132/73  Pulse: 73 (!) 103  Resp: 15 16  Temp:  36.4 C    Last Pain:  Vitals:   07/19/16 1409  TempSrc:   PainSc: 0-No pain                 Lewie LoronJohn Amandalynn Pitz

## 2016-07-19 NOTE — Transfer of Care (Signed)
Immediate Anesthesia Transfer of Care Note  Patient: Rachel Hinton  Procedure(s) Performed: Procedure(s): SHOULDER ARTHROSCOPY, DEBRIDEMENT PARTIAL ROTATOR CUFF TEAR, SUBACROMIAL DECOMPRESSION, DISTAL CLAVICAL EXCISION (Right) SUBACROMIAL DECOMPRESSION (Right)  Patient Location: PACU  Anesthesia Type:GA combined with regional for post-op pain  Level of Consciousness: sedated  Airway & Oxygen Therapy: Patient Spontanous Breathing and Patient connected to face mask oxygen  Post-op Assessment: Report given to RN and Post -op Vital signs reviewed and stable  Post vital signs: Reviewed and stable  Last Vitals:  Vitals:   07/19/16 1045 07/19/16 1048  BP:  115/70  Pulse: 64 65  Resp:  15    Last Pain:  Vitals:   07/19/16 1005  TempSrc: Oral         Complications: No apparent anesthesia complications

## 2016-07-19 NOTE — Op Note (Signed)
Procedure(s):   Rachel Hinton Labella female 51 y.o. 07/19/2016  Procedure(s) and Anesthesia Type:  #1 right shoulder arthroscopic debridement partial thickness rotator cuff tear #2 right shoulder arthroscopic subacromial decompression #3 right shoulder arthroscopic distal clavicle excision  Surgeon(s) and Role:    * Jones BroomJustin Kiyon Fidalgo, MD - Primary     Surgeon: Mable ParisHANDLER,Henryetta Corriveau WILLIAM   Assistants: Damita Lackanielle Lalibert PA-C (Danielle was present and scrubbed throughout the procedure and was essential in positioning, assisting with the camera and instrumentation,, and closure)  Anesthesia: General endotracheal anesthesia with preoperative interscalene block given by the attending anesthesiologist     Procedure Detail    Estimated Blood Loss: Min         Drains: none  Blood Given: none         Specimens: none        Complications:  * No complications entered in OR log *         Disposition: PACU - hemodynamically stable.         Condition: stable    Procedure:   INDICATIONS FOR SURGERY: The patient is 51 y.o. female who suffered an injury at work. She failed extensive conservative measures with rest, injection therapy, physical therapy, and medications. MRI revealed partial-thickness bursal sided rotator cuff tear. She also had before acromioclavicular internal derangement with persistent pain. Indicated for surgical treatment to try and decrease pain and restore function.  OPERATIVE FINDINGS: Examination under anesthesia: No stiffness or instability   DESCRIPTION OF PROCEDURE: The patient was identified in preoperative  holding area where I personally marked the operative site after  verifying site, side, and procedure with the patient. An interscalene block was given by the attending anesthesiologist the holding area.  The patient was taken back to the operating room where general anesthesia was induced without complication and was placed in the beach-chair position with  the back  elevated about 60 degrees and all extremities and head and neck carefully padded and  positioned.   The right upper extremity was then prepped and  draped in a standard sterile fashion. The appropriate time-out  procedure was carried out. The patient did receive IV antibiotics  within 30 minutes of incision.   A small posterior portal incision was made and the arthroscope was introduced into the joint. An anterior portal was then established above the subscapularis using needle localization. Small cannula was placed anteriorly. Diagnostic arthroscopy was then carried out.  Subscapularis was noted to be intact. There is mild fraying of the anterior superior labrum which was debrided. No detachment labrum. Biceps tendon was intact. Supraspinatus and infraspinatus were intact on the articular side. Humeral joint surfaces were intact without significant chondromalacia.  The arthroscope was then introduced into the subacromial space a standard lateral portal was established with needle localization. The shaver was used through the lateral portal to perform extensive bursectomy. Coracoacromial ligament was examined and found to be frayed indicating impingement.  The bursal side of the rotator cuff was carefully examined. There is an anterior area of mild fraying or partial tearing which involves less than 5% of the thickness of the tendon. There is no exposed tuberosity. This was extensively debrided down to healthy bleeding tendon.  The coracoacromial ligament was taken down off the anterior acromion with the ArthroCare exposing a moderate hooked anterior acromial spur. A high-speed bur was then used through the lateral portal to take down the anterior acromial spur from lateral to medial in a standard acromioplasty.  The acromioplasty was also viewed from  the lateral portal and the bur was used as necessary to ensure that the acromion was completely flat from posterior to anterior.  The  distal clavicle was exposed arthroscopically and the bur was used to take off the undersurface for approximately 8 mm from the lateral portal. The bur was then moved to an anterior portal position to complete the distal clavicle excision resecting about 8 mm of the distal clavicle and a smooth even fashion. This was viewed from anterior and lateral portals and felt to be complete.  The arthroscopic equipment was removed from the joint and the portals were closed with 3-0 nylon in an interrupted fashion. Sterile dressings were then applied including Xeroform 4 x 4's ABDs and tape. The patient was then allowed to awaken from general anesthesia, placed in a sling, transferred to the stretcher and taken to the recovery room in stable condition.   POSTOPERATIVE PLAN: The patient will be discharged home today and will followup in one week for suture removal and wound check.  We will get her back in the therapy right away.

## 2016-07-19 NOTE — Progress Notes (Signed)
Assisted Dr. Germeroth with right, ultrasound guided, interscalene  block. Side rails up, monitors on throughout procedure. See vital signs in flow sheet. Tolerated Procedure well.  

## 2016-07-19 NOTE — Discharge Instructions (Signed)
Discharge Instructions after Arthroscopic Shoulder Surgery ° ° °A sling has been provided for you. You may remove the sling after 72 hours. The sling may be worn for your protection, if you are in a crowd.  °Use ice on the shoulder intermittently over the first 48 hours after surgery.  °Pain medication has been prescribed for you.  °Use your medication liberally over the first 48 hours, and then begin to taper your use. You may take Extra Strength Tylenol or Tylenol only in place of the pain pills. DO NOT take ANY nonsteroidal anti-inflammatory pain medications: Advil, Motrin, Ibuprofen, Aleve, Naproxen, or Naprosyn.  °You may remove your dressing after two days.  °You may shower 5 days after surgery. The incision CANNOT get wet prior to 5 days. Simply allow the water to wash over the site and then pat dry. Do not rub the incision. Make sure your axilla (armpit) is completely dry after showering.  °Take one aspirin a day for 2 weeks after surgery, unless you have an aspirin sensitivity/allergy or asthma.  °Three to 5 times each day you should perform assisted overhead reaching and external rotation (outward turning) exercises with the operative arm. Both exercises should be done with the non-operative arm used as the "therapist arm" while the operative arm remains relaxed. Ten of each exercise should be done three to five times each day. ° ° ° °Overhead reach is helping to lift your stiff arm up as high as it will go. To stretch your overhead reach, lie flat on your back, relax, and grasp the wrist of the tight shoulder with your opposite hand. Using the power in your opposite arm, bring the stiff arm up as far as it is comfortable. Start holding it for ten seconds and then work up to where you can hold it for a count of 30. Breathe slowly and deeply while the arm is moved. Repeat this stretch ten times, trying to help the arm up a little higher each time.  ° ° ° ° ° °External rotation is turning the arm out to  the side while your elbow stays close to your body. External rotation is best stretched while you are lying on your back. Hold a cane, yardstick, broom handle, or dowel in both hands. Bend both elbows to a right angle. Use steady, gentle force from your normal arm to rotate the hand of the stiff shoulder out away from your body. Continue the rotation as far as it will go comfortably, holding it there for a count of 10. Repeat this exercise ten times.  ° ° ° °Please call [phone] during normal business hours or [phone] after hours for any problems. Including the following: ° °- excessive redness of the incisions °- drainage for more than 4 days °- fever of more than 101.5 F ° °*Please note that pain medications will not be refilled after hours or on weekends. ° ° °Regional Anesthesia Blocks ° °1. Numbness or the inability to move the "blocked" extremity may last from 3-48 hours after placement. The length of time depends on the medication injected and your individual response to the medication. If the numbness is not going away after 48 hours, call your surgeon. ° °2. The extremity that is blocked will need to be protected until the numbness is gone and the  Strength has returned. Because you cannot feel it, you will need to take extra care to avoid injury. Because it may be weak, you may have difficulty moving it or using   it. You may not know what position it is in without looking at it while the block is in effect. ° °3. For blocks in the legs and feet, returning to weight bearing and walking needs to be done carefully. You will need to wait until the numbness is entirely gone and the strength has returned. You should be able to move your leg and foot normally before you try and bear weight or walk. You will need someone to be with you when you first try to ensure you do not fall and possibly risk injury. ° °4. Bruising and tenderness at the needle site are common side effects and will resolve in a few  days. ° °5. Persistent numbness or new problems with movement should be communicated to the surgeon or the [facility] ([phone])/ [facility] ([phone]). ° ° °Post Anesthesia Home Care Instructions ° °Activity: °Get plenty of rest for the remainder of the day. A responsible adult should stay with you for 24 hours following the procedure.  °For the next 24 hours, DO NOT: °-Drive a car °-Operate machinery °-Drink alcoholic beverages °-Take any medication unless instructed by your physician °-Make any legal decisions or sign important papers. ° °Meals: °Start with liquid foods such as gelatin or soup. Progress to regular foods as tolerated. Avoid greasy, spicy, heavy foods. If nausea and/or vomiting occur, drink only clear liquids until the nausea and/or vomiting subsides. Call your physician if vomiting continues. ° °Special Instructions/Symptoms: °Your throat may feel dry or sore from the anesthesia or the breathing tube placed in your throat during surgery. If this causes discomfort, gargle with warm salt water. The discomfort should disappear within 24 hours. ° °If you had a scopolamine patch placed behind your ear for the management of post- operative nausea and/or vomiting: ° °1. The medication in the patch is effective for 72 hours, after which it should be removed.  Wrap patch in a tissue and discard in the trash. Wash hands thoroughly with soap and water. °2. You may remove the patch earlier than 72 hours if you experience unpleasant side effects which may include dry mouth, dizziness or visual disturbances. °3. Avoid touching the patch. Wash your hands with soap and water after contact with the patch. °  ° °

## 2016-07-19 NOTE — H&P (Signed)
Rachel Hinton is an 51 y.o. female.   Chief Complaint: R shoulder pain  HPI: R shoulder pain after injury at work, failed conservative treatment.  Past Medical History:  Diagnosis Date  . 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 CESAREAN SECTION      History reviewed. No pertinent family history. Social History:  reports that she has never smoked. She has never used smokeless tobacco. She reports that she does not drink alcohol or use drugs.  Allergies:  Allergies  Allergen Reactions  . Acyclovir And Related     No prescriptions prior to admission.    No results found for this or any previous visit (from the past 48 hour(s)). No results found.  Review of Systems  All other systems reviewed and are negative.   Blood pressure 115/70, pulse 65, resp. rate 15, height 5\' 3"  (1.6 m), weight 76.2 kg (168 lb), last menstrual period 07/13/2014, SpO2 98 %. Physical Exam  Constitutional: She is oriented to person, place, and time. She appears well-developed and well-nourished.  HENT:  Head: Atraumatic.  Eyes: EOM are normal.  Cardiovascular: Intact distal pulses.   Respiratory: Effort normal.  Musculoskeletal:  R shoulder pain with RC testing, [facility].  Neurological: She is alert and oriented to person, place, and time.  Skin: Skin is warm and dry.  Psychiatric: She has a normal mood and affect.     Assessment/Plan R shoulder pain after injury at work, failed conservative treatment. Plan R arthroscopic RC debridement vs repair, SAD, DCR Risks / benefits of surgery discussed Consent on chart  NPO for OR Preop antibiotics   Mable ParisHANDLER,[name] WILLIAM, MD 07/19/2016, 11:24 AM

## 2016-07-19 NOTE — Anesthesia Procedure Notes (Signed)
Procedure Name: Intubation Date/Time: 07/19/2016 11:54 AM Performed by: Maryella Shivers Pre-anesthesia Checklist: Patient identified, Emergency Drugs available, Suction available and Patient being monitored Patient Re-evaluated:Patient Re-evaluated prior to inductionOxygen Delivery Method: Circle system utilized Preoxygenation: Pre-oxygenation with 100% oxygen Intubation Type: IV induction Ventilation: Mask ventilation without difficulty Laryngoscope Size: Mac and 3 Grade View: Grade II Tube type: Oral Tube size: 7.0 mm Number of attempts: 2 Airway Equipment and Method: Stylet and Oral airway Placement Confirmation: ETT inserted through vocal cords under direct vision,  positive ETCO2 and breath sounds checked- equal and bilateral Secured at: 20 cm Tube secured with: Tape Dental Injury: Teeth and Oropharynx as per pre-operative assessment

## 2016-07-19 NOTE — Progress Notes (Signed)
Patient denies any allergies when asked during interview.

## 2016-07-19 NOTE — Anesthesia Preprocedure Evaluation (Signed)
Anesthesia Evaluation  Patient identified by MRN, date of birth, ID band Patient awake    Reviewed: Allergy & Precautions, NPO status , Patient's Chart, lab work & pertinent test results  Airway Mallampati: II  TM Distance: >3 FB Neck ROM: Full    Dental no notable dental hx.    Pulmonary neg pulmonary ROS,    Pulmonary exam normal breath sounds clear to auscultation       Cardiovascular negative cardio ROS Normal cardiovascular exam Rhythm:Regular Rate:Normal     Neuro/Psych negative neurological ROS  negative psych ROS   GI/Hepatic negative GI ROS, Neg liver ROS,   Endo/Other  negative endocrine ROS  Renal/GU negative Renal ROS     Musculoskeletal negative musculoskeletal ROS (+)   Abdominal   Peds  Hematology negative hematology ROS (+)   Anesthesia Other Findings   Reproductive/Obstetrics negative OB ROS                             Anesthesia Physical Anesthesia Plan  ASA: II  Anesthesia Plan: General and Regional   Post-op Pain Management: GA combined w/ Regional for post-op pain   Induction: Intravenous  Airway Management Planned: Oral ETT  Additional Equipment:   Intra-op Plan:   Post-operative Plan: Extubation in OR  Informed Consent: I have reviewed the patients History and Physical, chart, labs and discussed the procedure including the risks, benefits and alternatives for the proposed anesthesia with the patient or authorized representative who has indicated his/her understanding and acceptance.   Dental advisory given  Plan Discussed with: CRNA  Anesthesia Plan Comments:         Anesthesia Quick Evaluation

## 2016-07-20 ENCOUNTER — Encounter (HOSPITAL_BASED_OUTPATIENT_CLINIC_OR_DEPARTMENT_OTHER): Payer: Self-pay | Admitting: Orthopedic Surgery

## 2016-07-28 ENCOUNTER — Encounter: Payer: Self-pay | Admitting: Physical Therapy

## 2016-07-28 ENCOUNTER — Ambulatory Visit: Payer: PRIVATE HEALTH INSURANCE | Attending: Orthopedic Surgery | Admitting: Physical Therapy

## 2016-07-28 DIAGNOSIS — M6281 Muscle weakness (generalized): Secondary | ICD-10-CM | POA: Insufficient documentation

## 2016-07-28 DIAGNOSIS — M62838 Other muscle spasm: Secondary | ICD-10-CM | POA: Insufficient documentation

## 2016-07-28 DIAGNOSIS — M25511 Pain in right shoulder: Secondary | ICD-10-CM | POA: Diagnosis present

## 2016-07-28 NOTE — Therapy (Signed)
[place] [facility] [address], [place], [address] Phone: [phone]   Fax:  [phone]  Physical Therapy Evaluation  Patient Details  Name: Rachel Hinton MRN: [id number] Date of Birth: 1966-05-30 Referring Provider: Jones Broom MD  Encounter Date: 07/28/2016      PT End of Session - 07/28/16 0936    Visit Number 1   Number of Visits 12   Date for PT Re-Evaluation 09/08/16   Authorization Type 1   Authorization Time Period 12 WC   PT Start Time 0932   PT Stop Time 1039   PT Time Calculation (min) 67 min   Activity Tolerance Patient tolerated treatment well;Patient limited by fatigue   Behavior During Therapy [facility] for tasks assessed/performed      Past Medical History:  Diagnosis Date  . 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 CESAREAN SECTION    . SHOULDER ARTHROSCOPY WITH DISTAL CLAVICLE RESECTION Right 07/19/2016   Procedure: SHOULDER ARTHROSCOPY, DEBRIDEMENT PARTIAL ROTATOR CUFF TEAR, SUBACROMIAL DECOMPRESSION, DISTAL CLAVICAL EXCISION;  Surgeon: Jones Broom, MD;  Location: [facility];  Service: Orthopedics;  Laterality: Right;  . SUBACROMIAL DECOMPRESSION Right 07/19/2016   Procedure: SUBACROMIAL DECOMPRESSION;  Surgeon: Jones Broom, MD;  Location: [facility];  Service: Orthopedics;  Laterality: Right;    There were no vitals filed for this visit.       Subjective Assessment - 07/28/16 0943    Subjective Patient reports she is having weakness and less pain in right shoulder since surgery. She is currently out of work.    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 She noticed that after several hours she was beginning to have problems  with cleaning mirrors (above shoulder level) and turning on/off faucettes (reaching and rotation of shoulder). The next day she continued with pain and she was seen by MD on that tuesday and was put in a sling, conservative care with medication, rest, light duty at work. She has had previous PT prior to surgery without lasting results and underwent MRI and then surgery 07/19/2016 and is now referred back for physical therapy.    Limitations Lifting;House hold activities;Other (comment)  work related tasks   Patient Stated Goals To be able to use right arm without pain and back to normal activity   Currently in Pain? Yes   Pain Score 5    Pain Location Shoulder   Pain Orientation Right   Pain Descriptors / Indicators Sore;Constant   Pain Type Acute pain;Surgical pain  07/19/2016   Pain Onset 1 to 4 weeks ago   Pain Frequency Constant   Aggravating Factors  moving right arm, coughing, sneezing   Pain Relieving Factors medication, ice, rest   Effect of Pain on Daily Activities right handed: grooming, dressing, pushing, pulling, tying shoes            [facility] PT Assessment - 07/28/16 0950      Assessment   Medical Diagnosis s/p shoulder rotator cuff debridement, SAD, DCE    Referring Provider Jones Broom MD   Onset Date/Surgical Date 07/19/16   Hand Dominance Right   Next MD Visit Unknown   Prior Therapy pre surgery     Precautions   Precautions Shoulder   Type  of Shoulder Precautions no lifting      Restrictions   Weight Bearing Restrictions No     Balance Screen   Has the patient fallen in the past 6 months No   Has the patient had a decrease in activity level because of a fear of falling?  No   Is the patient reluctant to leave their home because of a fear of falling?  No     Home Environment   Living Environment Private residence   Living Arrangements Spouse/significant other;Children  73 year old son   Type of Home House   Home Access Level entry   Home Layout One  level     Prior Function   Level of Independence Independent   Vocation Workers comp   Control and instrumentation engineer services: [employer]   Leisure enjoys family, camping etc.      Cognition   Overall Cognitive Status Within Functional Limits for tasks assessed     Observation/Other Assessments   Observations no sling, healing scars at portals, ecchymosis anterior right shoulder and under upper arm to side of trunk    Skin Integrity intact     Sensation   Light Touch Appears Intact     Palpation   Palpation comment point tender over anterior right shoulder, + spasms upper trapezius, cervical spine      Objective: Right shoulder: AAROM/PROM; flexion 0-70 (supine lying), ER 10 degrees with pain limiting further motion Strength: testing deferred due to 1 week post surgery, precautions Outcome measure:  quickDash 77% self perceived disability (0 = no self perceived disabiltiy)  Treatment: Instructed in home program: use of ice and positioning to decreased swelling/pain right shoulder AAROM/PROM right UE shoulder with patient supine lying: using left UE for assist: flexion to 70 degrees, ER to 10 degrees with pain limiting further motion Pendulums Scapular retraction in sitting x 5 reps  Modalities: Electrical stimulation: high volt applied (2) electrodes to upper trapezius and posterior aspect of right shoulder, russian stim. Applied to right scapula/lower trapezius muscles 10/10 cycle with intensity to tolerance with ice pack applied to right shoulder with patient seated with right UE supported on pillow x 15 min.: goal muscle re education, muscle spasm reduction, pain control  Patient response to treatment: patient demonstrated good technique with exercises with moderate VC and demonstration for correct alignment. Patient with decreased pain from   5/10 to 2 /10 with decreased spasms and tenderness to mild at end of session following ice/electrical stimulation. No  adverse reactions to skin noted at end of session.          PT Education - 07/28/16 1035    Education provided Yes   Education Details HEP: instructed in use of ice, exercise for ROM every 2 hours, pendulums, scapular retraction, AAROM right shoulder, Active ROM right elbow, forearm and wrist/hand   Person(s) Educated Patient   Methods Explanation;Demonstration;Verbal cues;Handout   Comprehension Verbalized understanding;Returned demonstration;Verbal cues required;Need further instruction             PT Long Term Goals - 07/28/16 1126      PT LONG TERM GOAL #1   Title Patient will demonstrate improved function with right UE for daily tasks as indicated by QuickDash score of 20% or less by 09/08/2016   Baseline current 77%   Status New     PT LONG TERM GOAL #2   Title Patient will demonstrate full pain free AROM for overhead and behind back with right UE use for personal  care and work related activities by 09/08/2016   Baseline unable to raise right UE without pain limiting motion, passive ROM <90 flexion   Status New     PT LONG TERM GOAL #3   Title Patient will be independent with home program for pain control, exercise for right UE to allow self management once discharged from physical therapy by 09/08/2016   Baseline limited knowledge of appropriate exercises, pain control strategies and progression    Status New               Plan - 07/28/16 [id number]    Clinical Impression Statement Patient is s/p right shoulder rotator cuff debridement, SAD, DCE x 1 week and presents with limitations of pain, ROM and strength. She is improving at this time with improved abiltiy to move right UE. she has limited flexion, rotations and decreased strength which limits functional use of right UE for grooming, dressing, work and home related activties. She has limited knowledge of appropriate pain control strategies, exercises and progression in order to return to prior level of function and  will require physical therapy intervention.     Rehab Potential Good   Clinical Impairments Affecting Rehab Potential (+)motivated, acute condition, PLOF   PT Frequency 2x / week   PT Duration 6 weeks   PT Treatment/Interventions Electrical Stimulation;Cryotherapy;Moist Heat;Ultrasound;Patient/family education;Neuromuscular re-education;Therapeutic exercise;Manual techniques;Passive range of motion;Dry needling;Iontophoresis 4mg /ml Dexamethasone   PT Next Visit Plan pain control, manual techniques for spasms, ROM, therapeutic exercises   PT Home Exercise Plan posture correction, scapular retraction, pendulums, use of heat/ice   Consulted and Agree with Plan of Care Patient      Patient will benefit from skilled therapeutic intervention in order to improve the following deficits and impairments:  Decreased strength, Impaired flexibility, Pain, Impaired perceived functional ability, Decreased activity tolerance, Increased muscle spasms, Impaired UE functional use, Decreased range of motion  Visit Diagnosis: Muscle weakness (generalized) - Plan: PT plan of care cert/re-cert  Acute pain of right shoulder - Plan: PT plan of care cert/re-cert  Other muscle spasm - Plan: PT plan of care cert/re-cert     Problem List There are no active problems to display for this patient.   Beacher MayBrooks, Annalysia Willenbring PT 07/28/2016, 1:07 PM  [place] [facility] [address] [place], [place], [address] Phone: [phone]   Fax:  [phone]  Name: Sherrine Mapleslejandra Riano MRN: [id number] Date of Birth: [birth year]

## 2016-07-30 ENCOUNTER — Ambulatory Visit: Payer: PRIVATE HEALTH INSURANCE | Attending: Orthopedic Surgery | Admitting: Physical Therapy

## 2016-07-30 ENCOUNTER — Encounter: Payer: Self-pay | Admitting: Physical Therapy

## 2016-07-30 DIAGNOSIS — M25511 Pain in right shoulder: Secondary | ICD-10-CM | POA: Diagnosis not present

## 2016-07-30 DIAGNOSIS — M6281 Muscle weakness (generalized): Secondary | ICD-10-CM | POA: Diagnosis present

## 2016-07-30 DIAGNOSIS — M62838 Other muscle spasm: Secondary | ICD-10-CM | POA: Diagnosis present

## 2016-07-30 NOTE — Therapy (Signed)
[place] [facility] [address] [place], [place], [address] Phone: [phone]   Fax:  [phone]  Physical Therapy Treatment  Patient Details  Name: Rachel Mapleslejandra Nakagawa MRN: [id number] Date of Birth: 09/17/1965 Referring Provider: Jones Broomhandler, Justin MD  Encounter Date: 07/30/2016      PT End of Session - 07/30/16 0903    Visit Number 2   Number of Visits 12   Date for PT Re-Evaluation 09/08/16   Authorization Type 2   Authorization Time Period 12 WC   PT Start Time 0809   PT Stop Time 0900   PT Time Calculation (min) 51 min   Activity Tolerance Patient tolerated treatment well;Patient limited by pain   Behavior [facility] tasks assessed/performed      Past Medical History:  Diagnosis Date  . 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 CESAREAN SECTION    . SHOULDER ARTHROSCOPY WITH DISTAL CLAVICLE RESECTION Right 07/19/2016   Procedure: SHOULDER ARTHROSCOPY, DEBRIDEMENT PARTIAL ROTATOR CUFF TEAR, SUBACROMIAL DECOMPRESSION, DISTAL CLAVICAL EXCISION;  Surgeon: Jones BroomJustin Chandler, MD;  Location: [facility];  Service: Orthopedics;  Laterality: Right;  . SUBACROMIAL DECOMPRESSION Right 07/19/2016   Procedure: SUBACROMIAL DECOMPRESSION;  Surgeon: Jones BroomJustin Chandler, MD;  Location: [facility];  Service: Orthopedics;  Laterality: Right;    There were no vitals filed for this visit.      Subjective Assessment - 07/30/16 0837    Subjective Patient reports increased pain and soreness trying to exercise frequently throughout the day so she is now only able to perform ROM 3x/day. She reports her right ear was hurting up until yesterday and this is improving. She is still using ice and medication to relieve pain.    Limitations Lifting;House hold activities;Other (comment)  work releated tasks   Patient Stated Goals To be able to  use right arm without pain and back to normal activity   Currently in Pain? Yes   Pain Score 5    Pain Location Shoulder   Pain Orientation Right   Pain Descriptors / Indicators Constant;Sore   Pain Type Acute pain;Surgical pain  07/19/16   Pain Onset 1 to 4 weeks ago   Pain Frequency Constant       Objective: Observation;  Right shoulder with mild to moderate swelling noted upper trapezius, into front of shoulder with increased warmth and ecchymosis around portal sites AAROM right shoulder <90 degrees limited by pain  Treatment:  Modalities: Electrical stimulation: Electrical stimulation: high volt applied (2) electrodes to upper trapezius and posterior aspect of right shoulder, russian stim. Applied to right scapula/lower trapezius muscles 10/10 cycle with intensity to tolerance with ice pack applied to right shoulder with patient seated with right UE supported on pillow x 25 min.: goal muscle re education, muscle spasm reduction, pain control Iontophoresis with dexamethasone 4mg /ml @ 40 ma*min applied with medium electrode to anterior aspect of right shoulder in conjunction with estim. (15 min to apply)   Therapeutic exercise: performed with VC, tactile cues and demonstration of therapist Pendulums re assessed, demonstration with VC Pulleys demonstrated and performed by patient seated facing and with back to pulleys and standing to 90 degrees with pain limiting further motion AAROM ER/IR on table with towel Scapular retraction performed following modalities with improved control and decreased pain to mild    Patient response to treatment: No adverse reactions to ice, iontophoresis noted. Patient  demonstrated improved technique with exercises with moderate VC for correct alignment. Patient with decreased pain from  5 /10 to 3 /10. Patient with decreased spasms by 50% following electrical stimulation. Improved motor control with repetition and cuing, following estim.        PT  Education - 07/30/16 0900    Education provided Yes   Education Details HEP: re assessed and modified exercises and added pulleys for ROM   Person(s) Educated Patient   Methods Explanation;Demonstration;Verbal cues   Comprehension Verbalized understanding;Returned demonstration;Verbal cues required;Need further instruction             PT Long Term Goals - 07/28/16 1126      PT LONG TERM GOAL #1   Title Patient will demonstrate improved function with right UE for daily tasks as indicated by QuickDash score of 20% or less by 09/08/2016   Baseline current 77%   Status New     PT LONG TERM GOAL #2   Title Patient will demonstrate full pain free AROM for overhead and behind back with right UE use for personal care and work related activities by 09/08/2016   Baseline unable to raise right UE without pain limiting motion, passive ROM <90 flexion   Status New     PT LONG TERM GOAL #3   Title Patient will be independent with home program for pain control, exercise for right UE to allow self management once discharged from physical therapy by 09/08/2016   Baseline limited knowledge of appropriate exercises, pain control strategies and progression    Status New               Plan - 07/30/16 0905    Clinical Impression Statement Patient exercise limited by pain right shoulder. improved pain with treatment and able to perform mild ROM exercises which were modified. She requires continued PT to address pain, swelling and progress exercises in order to regain full ROM, strength to return to prior level of function.   Rehab Potential Good   PT Frequency 2x / week   PT Duration 6 weeks   PT Treatment/Interventions Electrical Stimulation;Cryotherapy;Moist Heat;Ultrasound;Patient/family education;Neuromuscular re-education;Therapeutic exercise;Manual techniques;Passive range of motion;Dry needling;Iontophoresis 4mg /ml Dexamethasone   PT Next Visit Plan pain control, manual techniques for  spasms, ROM, therapeutic exercises   PT Home Exercise Plan posture correction, scapular retraction, pendulums, use of heat/ice; pulleys for ROM      Patient will benefit from skilled therapeutic intervention in order to improve the following deficits and impairments:  Decreased strength, Impaired flexibility, Pain, Impaired perceived functional ability, Decreased activity tolerance, Increased muscle spasms, Impaired UE functional use, Decreased range of motion  Visit Diagnosis: Acute pain of right shoulder  Other muscle spasm  Muscle weakness (generalized)     Problem List There are no active problems to display for this patient.   Beacher May PT 07/30/2016, 12:50 PM  [place] [facility] PHYSICAL AND SPORTS MEDICINE [address] [place], [address] Phone: [phone]   Fax:  [phone]  Name: Desree Leap MRN: [id number] Date of Birth: Nov 08, 1965

## 2016-08-02 ENCOUNTER — Encounter: Payer: Self-pay | Admitting: Physical Therapy

## 2016-08-02 ENCOUNTER — Ambulatory Visit: Payer: PRIVATE HEALTH INSURANCE | Admitting: Physical Therapy

## 2016-08-02 DIAGNOSIS — M25511 Pain in right shoulder: Secondary | ICD-10-CM | POA: Diagnosis not present

## 2016-08-02 DIAGNOSIS — M62838 Other muscle spasm: Secondary | ICD-10-CM

## 2016-08-02 DIAGNOSIS — M6281 Muscle weakness (generalized): Secondary | ICD-10-CM

## 2016-08-02 NOTE — Therapy (Signed)
[facility] [address] [place], [place], [address] Phone: [phone]   Fax:  [phone]  Physical Therapy Treatment  Patient Details  Name: Rachel Hinton MRN: [id number] Date of Birth: 07/22/1965 Referring Provider: Jones Broomhandler, Justin MD  Encounter Date: 08/02/2016      PT End of Session - 08/02/16 0947    Visit Number 3   Number of Visits 12   Date for PT Re-Evaluation 09/08/16   Authorization Type 3   Authorization Time Period 12 WC   PT Start Time 0945   PT Stop Time 1032   PT Time Calculation (min) 47 min   Activity Tolerance Patient tolerated treatment well;Patient limited by pain   Behavior [facility] Therapy [facility] for tasks assessed/performed      Past Medical History:  Diagnosis Date  . 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 CESAREAN SECTION    . SHOULDER ARTHROSCOPY WITH DISTAL CLAVICLE RESECTION Right 07/19/2016   Procedure: SHOULDER ARTHROSCOPY, DEBRIDEMENT PARTIAL ROTATOR CUFF TEAR, SUBACROMIAL DECOMPRESSION, DISTAL CLAVICAL EXCISION;  Surgeon: Jones BroomJustin Chandler, MD;  Location: [facility];  Service: Orthopedics;  Laterality: Right;  . SUBACROMIAL DECOMPRESSION Right 07/19/2016   Procedure: SUBACROMIAL DECOMPRESSION;  Surgeon: Jones BroomJustin Chandler, MD;  Location: [facility];  Service: Orthopedics;  Laterality: Right;    There were no vitals filed for this visit.      Subjective Assessment - 08/02/16 0949    Subjective Improving with less tender in front of shoulder and able to walk without right shoulder pain now; dressing is getting easier and was able to open a jar with less difficulty and still having more difficulty with hair care and drying self after a shower. she is exercising as tolerated at home and still using ice and medication for pain control as needed.   Limitations Lifting;House hold  activities;Other (comment)  work related tasks   Patient Stated Goals To be able to use right arm without pain and back to normal activity   Currently in Pain? Yes   Pain Score 4    Pain Location Shoulder   Pain Orientation Right   Pain Descriptors / Indicators Aching;Other (Comment)  weak   Pain Type Acute pain;Surgical pain  07/19/2016   Pain Onset 1 to 4 weeks ago   Pain Frequency Intermittent        Objective: Observation;  Right shoulder with mild swelling upper trapezius, mild ecchymosis anterior and posterior aspect, decreased as compared to previous session, mild warmth anterior aspect right shoulder, decreased as compared to previous session AAROM right shoulder 90 degrees forward elevation and abduction limited by pain; ER to 45 degrees with pain limiting further motion  Treatment:  Modalities: Electrical stimulation: high volt applied (2) electrodes to upper trapezius and posterior aspect of right shoulder, russian stim. applied to right scapula/lower trapezius muscles 10/10 cycle with intensity to tolerance with ice pack applied to right shoulder with patient seated with right UE supported on pillow x 20 min.: goal muscle re education, muscle spasm reduction, pain control Iontophoresis with dexamethasone 4mg /ml @ 40 ma*min applied with large electrode to anterior aspect of right shoulder in conjunction with estim. (12 min to apply)   Therapeutic exercise: performed with VC, tactile cues and demonstration of therapist Supine lying: AAROM with assistance of therapist forward elevation up to 90 degrees with pain limiting further motion, multiple reps/sets; ER/IR in scapular plane  of motion x 10 reps Scapular retraction with VC/tactile cues x 10 reps  Patient response to treatment: No adverse reactions to ice/estim or iontophoresis treatments. Improved ability to tolerate ROM exercises with repetition. Decreased spasms and pain by 50% following modalities at end of session.  Improved scapular retraction with no pain following modalities.           PT Education - 08/02/16 [phone]    Education provided Yes   Education Details HEP: re assesed exercises and using pulleys forward facing position   Person(s) Educated Patient   Methods Explanation;Demonstration;Verbal cues   Comprehension Verbalized understanding;Returned demonstration;Verbal cues required;Need further instruction             PT Long Term Goals - 07/28/16 1126      PT LONG TERM GOAL #1   Title Patient will demonstrate improved function with right UE for daily tasks as indicated by QuickDash score of 20% or less by 09/08/2016   Baseline current 77%   Status New     PT LONG TERM GOAL #2   Title Patient will demonstrate full pain free AROM for overhead and behind back with right UE use for personal care and work related activities by 09/08/2016   Baseline unable to raise right UE without pain limiting motion, passive ROM <90 flexion   Status New     PT LONG TERM GOAL #3   Title Patient will be independent with home program for pain control, exercise for right UE to allow self management once discharged from physical therapy by 09/08/2016   Baseline limited knowledge of appropriate exercises, pain control strategies and progression    Status New               Plan - 08/02/16 1021    Clinical Impression Statement Patient limited by pain during session. Demonstrates functional improvment with daily grooming, dressing, walking. She continues with pain and decreased ROM, strength and will continue to benefit from additional physical therapy intervention to achieve goals.    Rehab Potential Good   PT Frequency 2x / week   PT Duration 6 weeks   PT Treatment/Interventions Electrical Stimulation;Cryotherapy;Moist Heat;Ultrasound;Patient/family education;Neuromuscular re-education;Therapeutic exercise;Manual techniques;Passive range of motion;Dry needling;Iontophoresis 4mg /ml Dexamethasone    PT Next Visit Plan pain control, manual techniques for spasms, ROM, therapeutic exercises, iontophoresis   PT Home Exercise Plan posture correction, scapular retraction, pendulums, use of heat/ice; pulleys for ROM      Patient will benefit from skilled therapeutic intervention in order to improve the following deficits and impairments:  Decreased strength, Impaired flexibility, Pain, Impaired perceived functional ability, Decreased activity tolerance, Increased muscle spasms, Impaired UE functional use, Decreased range of motion  Visit Diagnosis: Acute pain of right shoulder  Other muscle spasm  Muscle weakness (generalized)     Problem List There are no active problems to display for this patient.   Beacher May PT 08/02/2016, 12:19 PM  [place] [facility] PHYSICAL AND SPORTS MEDICINE [address] [place], [address] Phone: [phone]   Fax:  [phone]  Name: Kyann Heydt MRN: [id number] Date of Birth: 11/14/1965

## 2016-08-04 ENCOUNTER — Ambulatory Visit: Payer: PRIVATE HEALTH INSURANCE | Admitting: Physical Therapy

## 2016-08-04 ENCOUNTER — Encounter: Payer: Self-pay | Admitting: Physical Therapy

## 2016-08-04 DIAGNOSIS — M25511 Pain in right shoulder: Secondary | ICD-10-CM | POA: Diagnosis not present

## 2016-08-04 DIAGNOSIS — M6281 Muscle weakness (generalized): Secondary | ICD-10-CM

## 2016-08-04 DIAGNOSIS — M62838 Other muscle spasm: Secondary | ICD-10-CM

## 2016-08-05 NOTE — Therapy (Signed)
[facility] [address] [place], [place], [address] Phone: [phone]   Fax:  [phone]  Physical Therapy Treatment  Patient Details  Name: Rachel Hinton MRN: [id number] Date of Birth: 27-Jan-1966 Referring Provider: Jones Broomhandler, Justin MD  Encounter Date: 08/04/2016      PT End of Session - 08/04/16 1122    Visit Number 4   Number of Visits 12   Date for PT Re-Evaluation 09/08/16   Authorization Type 4   Authorization Time Period 12 WC   PT Start Time 1115   PT Stop Time 1215   PT Time Calculation (min) 60 min   Activity Tolerance Patient tolerated treatment well;Patient limited by pain   Behavior [facility] for [facility] assessed/performed      Past Medical History:  Diagnosis Date  . 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 CESAREAN SECTION    . SHOULDER ARTHROSCOPY WITH DISTAL CLAVICLE RESECTION Right 07/19/2016   Procedure: SHOULDER ARTHROSCOPY, DEBRIDEMENT PARTIAL ROTATOR CUFF TEAR, SUBACROMIAL DECOMPRESSION, DISTAL CLAVICAL EXCISION;  Surgeon: Jones BroomJustin Chandler, MD;  Location: [facility];  Service: Orthopedics;  Laterality: Right;  . SUBACROMIAL DECOMPRESSION Right 07/19/2016   Procedure: SUBACROMIAL DECOMPRESSION;  Surgeon: Jones BroomJustin Chandler, MD;  Location: [facility];  Service: Orthopedics;  Laterality: Right;    There were no vitals filed for this visit.      Subjective Assessment - 08/04/16 1118    Subjective Moving better with pulleys, sleeping improving and using pillow to support arm, not able to sleep on right side.    Limitations Lifting;House hold activities;Other (comment)  work related tasks   Patient Stated Goals To be able to use right arm without pain and back to normal activity   Currently in Pain? Yes   Pain Score 4    Pain Location Shoulder   Pain Orientation Right   Pain  Descriptors / Indicators Aching  weak   Pain Type Acute pain;Surgical pain  07/19/2016   Pain Onset 1 to 4 weeks ago   Pain Frequency Intermittent      Objective: AROM: sitting: right UE forward elevation 85 degrees AAROM: supine lying/seated with pulleys right UE forward elevation up to 120 degrees with good alignment and minimal hiking of shoulder; ER 45 degrees Palpation: moderate spasms along upper trapezius right shoulder, mild to moderate tenderness anterior shoulder (improving with iontophoresis and estim. Treatment)  Treatment:   Modalities: Electrical stimulation: high volt applied (2) electrodes to upper trapezius and posterior aspect of right shoulder, russian stim. applied to right scapula/lower trapezius muscles 10/10 cycle with intensity to tolerance with ice pack applied to right shoulder with patient seated with right UE supported on pillow x 20min.: goal muscle re education, muscle spasm reduction, pain control Iontophoresis with dexamethasone 4mg /ml @ 40 ma*min applied with large electrode to anterior aspect of right shoulder in conjunction with estim. (10 min to apply)   Therapeutic exercise: performed with VC, tactile cues and demonstration of therapist Supine lying: AAROM with assistance of therapist forward elevation up to 105 degrees with pain limiting further motion, multiple reps/sets; ER to 45/IR across trunk in scapular plane of motion x 10 reps Scapular retraction with VC/tactile cues x 10 reps Punch to ceiling with assist to 90 degrees with assist to lower 2 x 5 reps Seated/standing; AAROM with pulleys with VC for correct alignment of shoulder and to decrease substitution  of hiking  Patient response to treatment: No adverse reactions to ice/estim or iontophoresis treatments. Improved AAROM with pulleys in seated position with back to pulleys. Pain limited motion achieved with all exercises. Decreased spasms and pain by >50% following modalities at end of  session.        PT Education - 08/04/16 1200    Education provided Yes   Education Details HEP: pulleys seated with back to door; scapular retraction, continue with ER with assist of towel   Person(s) Educated Patient   Methods Explanation;Demonstration;Verbal cues   Comprehension Verbalized understanding;Returned demonstration;Verbal cues required;Need further instruction             PT Long Term Goals - 07/28/16 1126      PT LONG TERM GOAL #1   Title Patient will demonstrate improved function with right UE for daily tasks as indicated by QuickDash score of 20% or less by 09/08/2016   Baseline current 77%   Status New     PT LONG TERM GOAL #2   Title Patient will demonstrate full pain free AROM for overhead and behind back with right UE use for personal care and work related activities by 09/08/2016   Baseline unable to raise right UE without pain limiting motion, passive ROM <90 flexion   Status New     PT LONG TERM GOAL #3   Title Patient will be independent with home program for pain control, exercise for right UE to allow self management once discharged from physical therapy by 09/08/2016   Baseline limited knowledge of appropriate exercises, pain control strategies and progression    Status New               Plan - 08/04/16 1222    Clinical Impression Statement Patient is improving AAROM shoulder elevation and ER with good carry over between sessions and able to perform exercises with less discomfort/pain. Patient is limited by pain/swelling and weakness in right UE s/p surgery. Demonstrates functional improvement with dressing, ability to sleep with less difficulty. She continues with limitations of pain, weakness, decreased ROM and will benefit from continued physical therapy intervention to achieve goals.     PT Frequency 2x / week   PT Duration 6 weeks   PT Treatment/Interventions Electrical Stimulation;Cryotherapy;Moist Heat;Ultrasound;Patient/family  education;Neuromuscular re-education;Therapeutic exercise;Manual techniques;Passive range of motion;Dry needling;Iontophoresis 4mg /ml Dexamethasone   PT Next Visit Plan pain control, manual techniques for spasms, ROM, therapeutic exercises, iontophoresis   PT Home Exercise Plan posture correction, scapular retraction, pendulums, use of heat/ice; pulleys for ROM      Patient will benefit from skilled therapeutic intervention in order to improve the following deficits and impairments:  Decreased strength, Impaired flexibility, Pain, Impaired perceived functional ability, Decreased activity tolerance, Increased muscle spasms, Impaired UE functional use, Decreased range of motion  Visit Diagnosis: Acute pain of right shoulder  Other muscle spasm  Muscle weakness (generalized)     Problem List There are no active problems to display for this patient.   Beacher May PT 08/05/2016, 5:23 PM  [facility] [address], [place], [address] Phone: [phone]   Fax:  [phone]  Name: Cyndie Woodbeck MRN: [id number] Date of Birth: March 03, 1966

## 2016-08-09 ENCOUNTER — Ambulatory Visit: Payer: PRIVATE HEALTH INSURANCE

## 2016-08-09 DIAGNOSIS — M25511 Pain in right shoulder: Secondary | ICD-10-CM | POA: Diagnosis not present

## 2016-08-09 DIAGNOSIS — M6281 Muscle weakness (generalized): Secondary | ICD-10-CM

## 2016-08-09 NOTE — Therapy (Signed)
[facility] [address] [place], [place], [address] Phone: [phone]   Fax:  [phone]  Physical Therapy Treatment  Patient Details  Name: Rachel Hinton MRN: [id number] Date of Birth: [birth year] Referring Provider: Jones Broomhandler, Justin MD  Encounter Date: 08/09/2016      PT End of Session - 08/09/16 1428    Visit Number 5   Number of Visits 12   Date for PT Re-Evaluation 09/08/16   Authorization Type 5   Authorization Time Period 12 WC   PT Start Time 1120   PT Stop Time 1205   PT Time Calculation (min) 45 min   Activity Tolerance Patient tolerated treatment well   Behavior During Therapy [facility] for tasks assessed/performed      Past Medical History:  Diagnosis Date  . 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 CESAREAN SECTION    . SHOULDER ARTHROSCOPY WITH DISTAL CLAVICLE RESECTION Right 07/19/2016   Procedure: SHOULDER ARTHROSCOPY, DEBRIDEMENT PARTIAL ROTATOR CUFF TEAR, SUBACROMIAL DECOMPRESSION, DISTAL CLAVICAL EXCISION;  Surgeon: Jones BroomJustin Chandler, MD;  Location: [facility];  Service: Orthopedics;  Laterality: Right;  . SUBACROMIAL DECOMPRESSION Right 07/19/2016   Procedure: SUBACROMIAL DECOMPRESSION;  Surgeon: Jones BroomJustin Chandler, MD;  Location: [facility];  Service: Orthopedics;  Laterality: Right;    There were no vitals filed for this visit.      Subjective Assessment - 08/09/16 1426    Subjective Pt is encouraged by her progress with therapy up to this point. She reports continued improvement in R shoulder flexion using pulleys. She is consistent with HEP. No specific questions or concerns at this time.    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 She  noticed that after several hours she was beginning to have problems with cleaning mirrors (above shoulder level) and turning on/off faucettes (reaching and rotation of shoulder). The next day she continued with pain and she was seen by MD on that tuesday and was put in a sling, conservative care with medication, rest, light duty at work. She has had previous PT prior to surgery without lasting results and underwent MRI and then surgery 07/19/2016 and is now referred back for physical therapy.    Limitations Lifting;House hold activities;Other (comment)  work related tasks   Diagnostic tests X rays right shoulder   Patient Stated Goals To be able to use right arm without pain and back to normal activity   Currently in Pain? Yes   Pain Score 3    Pain Location Shoulder   Pain Orientation Right   Pain Descriptors / Indicators Sore   Pain Type Chronic pain   Pain Onset 1 to 4 weeks ago   Pain Frequency Intermittent   Multiple Pain Sites No         TREATMENT  Electrical stimulation High volt applied (2) electrodes to upper trapezius and posterior aspect of right shoulder, russian stim. applied to right scapula/lower trapezius muscles 10/10 cycle with intensity to tolerance with ice pack applied to right shoulder with patient seated with right UE supported on pillow x 20 min.: goal muscle re education, muscle spasm reduction, pain control  Therapeutic exercise  PROM R shoulder for flexion, abduction, ER, and IR within patient tolerable range, never through resistance with  5s hold at end range x multiple bouts each direction; AAROM with cane and assistance of therapist for forward flexion to tolerable end range x 10; Seated scapular retraction with VC/tactile cues x 10 reps Supine serratus punches with gentle manual resistance x 10; CP applied to R shoulder at end of session x 5 minutes (unbilled); HEP review with patient                           PT Education -  08/09/16 1427    Education provided Yes   Education Details Continue HEP, exercise technique correction during session   Person(s) Educated Patient   Methods Explanation;Verbal cues;Tactile cues   Comprehension Verbalized understanding             PT Long Term Goals - 07/28/16 1126      PT LONG TERM GOAL #1   Title Patient will demonstrate improved function with right UE for daily tasks as indicated by QuickDash score of 20% or less by 09/08/2016   Baseline current 77%   Status New     PT LONG TERM GOAL #2   Title Patient will demonstrate full pain free AROM for overhead and behind back with right UE use for personal care and work related activities by 09/08/2016   Baseline unable to raise right UE without pain limiting motion, passive ROM <90 flexion   Status New     PT LONG TERM GOAL #3   Title Patient will be independent with home program for pain control, exercise for right UE to allow self management once discharged from physical therapy by 09/08/2016   Baseline limited knowledge of appropriate exercises, pain control strategies and progression    Status New               Plan - 08/09/16 1428    Clinical Impression Statement Patient demonstrates continued improvement in R shoulder PROM/AAROM. She is consistent with HEP. Encouraged pt to continue her current program and follow-up as scheduled.    Clinical Impairments Affecting Rehab Potential (+)motivated, acute condition, PLOF   PT Frequency 2x / week   PT Duration 6 weeks   PT Treatment/Interventions Electrical Stimulation;Cryotherapy;Moist Heat;Ultrasound;Patient/family education;Neuromuscular re-education;Therapeutic exercise;Manual techniques;Passive range of motion;Dry needling;Iontophoresis 4mg /ml Dexamethasone   PT Next Visit Plan pain control, manual techniques for spasms, ROM, therapeutic exercises, iontophoresis   PT Home Exercise Plan posture correction, scapular retraction, pendulums, use of heat/ice;  pulleys for ROM      Patient will benefit from skilled therapeutic intervention in order to improve the following deficits and impairments:  Decreased strength, Impaired flexibility, Pain, Impaired perceived functional ability, Decreased activity tolerance, Increased muscle spasms, Impaired UE functional use, Decreased range of motion  Visit Diagnosis: Muscle weakness (generalized)     Problem List There are no active problems to display for this patient.  Lynnea Maizes PT, DPT   Huprich,Jason 08/09/2016, 2:35 PM  [place] [facility] PHYSICAL AND SPORTS MEDICINE [address] [place], [address] Phone: [phone]   Fax:  [phone]  Name: Ryian Lynde MRN: [id number] Date of Birth: Dec 20, 1965

## 2016-08-11 ENCOUNTER — Ambulatory Visit: Payer: PRIVATE HEALTH INSURANCE | Admitting: Physical Therapy

## 2016-08-11 ENCOUNTER — Encounter: Payer: Self-pay | Admitting: Physical Therapy

## 2016-08-11 DIAGNOSIS — M6281 Muscle weakness (generalized): Secondary | ICD-10-CM

## 2016-08-11 DIAGNOSIS — M62838 Other muscle spasm: Secondary | ICD-10-CM

## 2016-08-11 DIAGNOSIS — M25511 Pain in right shoulder: Secondary | ICD-10-CM

## 2016-08-11 NOTE — Therapy (Signed)
[facility] [address], [place], [address] Phone: [phone]   Fax:  [phone]  Physical Therapy Treatment  Patient Details  Name: Rachel Hinton MRN: [id number] Date of Birth: 1966-06-11 Referring Provider: Jones Broom MD  Encounter Date: 08/11/2016      PT End of Session - 08/11/16 1223    Visit Number 6   Number of Visits 12   Date for PT Re-Evaluation 09/08/16   Authorization Type 6   Authorization Time Period 12 WC   PT Start Time 1120   PT Stop Time 1225   PT Time Calculation (min) 65 min   Activity Tolerance Patient tolerated treatment well   Behavior During Therapy [facility] for tasks assessed/performed      Past Medical History:  Diagnosis Date  . 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 CESAREAN SECTION    . SHOULDER ARTHROSCOPY WITH DISTAL CLAVICLE RESECTION Right 07/19/2016   Procedure: SHOULDER ARTHROSCOPY, DEBRIDEMENT PARTIAL ROTATOR CUFF TEAR, SUBACROMIAL DECOMPRESSION, DISTAL CLAVICAL EXCISION;  Surgeon: Jones Broom, MD;  Location: [facility];  Service: Orthopedics;  Laterality: Right;  . SUBACROMIAL DECOMPRESSION Right 07/19/2016   Procedure: SUBACROMIAL DECOMPRESSION;  Surgeon: Jones Broom, MD;  Location: [facility];  Service: Orthopedics;  Laterality: Right;    There were no vitals filed for this visit.      Subjective Assessment - 08/11/16 1120    Subjective Patient reports she was able to sneeze today for the first time without pain in right shoulder. She continues to see improvement with dresssing, able to do dishes now and is sleeping is better. She was able to wash hair with both hands today.    Limitations Lifting;House hold activities;Other (comment)   Patient Stated Goals To be able to use right arm without pain and back to normal activity   Currently in Pain?  Yes   Pain Score 3   with bra pressing on shoulder   Pain Location Shoulder   Pain Orientation Right   Pain Descriptors / Indicators Throbbing;Sore   Pain Type Acute pain;Surgical pain  07/19/2016   Pain Onset 1 to 4 weeks ago   Pain Frequency Intermittent      Objective; Observation; moderate swelling above clavicle, anterior aspect right shoulder, + spasms right upper trapezius AROM right shoulder forward elevation:  115 and increased pain/difficulty with lowering; improved at end of session without pain with lowering AAROM: right shoulder (supine) flexion 0-140, ER 60, IR 60 with mild tension/discomfort reported Palpation; right shoulder increased tenderness anterior aspect and upper trapezius with moderate palpable spasms  Treatment: Note: caseworker present to discuss patient progress during session Therapeutic exercise: performed with VC, tactile cues and demonstration of therapist Supine lying: AAROM with assistance of therapist forward elevation up to 140 degrees with painspasms limiting further motion, multiple reps/sets; ER to 60/IR to 60 in scapular plane of motion x 10 reps Scapular retraction with VC/tactile cues x 10 reps Punch to ceiling with assist to 90 degrees with assist to lower 2 x 5 reps Rhythmic stabilization supine with right UE at 90 degrees 2 sets 5-10 reps mild resistance given, then able to lower without increased pain in shoulder Short arc forward elevation 60 - 100 degrees with assistance x 5 -10 reps, ER short arc in supine in plane of scapula x 10 with assist (able to perform AROM to 90 following exercises  without increased pain with lowering) Seated/standing; AAROM with pulleys with VC for correct alignment of shoulder and to decrease substitution of hiking  Modalities: Electrical stimulation: high volt applied (2) electrodes to upper trapezius and posterior aspect of right shoulder, russian stim. applied to right scapula/lower trapezius muscles 10/10  cycle with intensity to tolerance with ice pack applied to right shoulder with patient seated with right UE supported on pillow x 20min.: goal muscle re education, muscle spasm reduction, pain control Iontophoresis with dexamethasone 4mg /ml @ 40 ma*min applied with largeelectrode to anterior aspect of right shoulder in conjunction with estim. (10min to apply)      Patient response to treatment: Improved ability to perform AROM in sitting to 90 degrees flexion without pain at end of session, following isometric exercises. No adverse reactions noted following modalities. Pain limited ROM throught session. Decreased spasms and pain to mild/none at end of session following modalities.         PT Education - 08/11/16 1222    Education provided Yes   Education Details HEP; modified exercises to short arc of motion in supine lying and ROM   Person(s) Educated Patient   Methods Explanation;Verbal cues;Tactile cues;Demonstration   Comprehension Verbalized understanding;Returned demonstration;Verbal cues required;Need further instruction             PT Long Term Goals - 07/28/16 1126      PT LONG TERM GOAL #1   Title Patient will demonstrate improved function with right UE for daily tasks as indicated by QuickDash score of 20% or less by 09/08/2016   Baseline current 77%   Status New     PT LONG TERM GOAL #2   Title Patient will demonstrate full pain free AROM for overhead and behind back with right UE use for personal care and work related activities by 09/08/2016   Baseline unable to raise right UE without pain limiting motion, passive ROM <90 flexion   Status New     PT LONG TERM GOAL #3   Title Patient will be independent with home program for pain control, exercise for right UE to allow self management once discharged from physical therapy by 09/08/2016   Baseline limited knowledge of appropriate exercises, pain control strategies and progression    Status New                Plan - 08/11/16 1230    Clinical Impression Statement Patient continues to demonstrate good progress towards goals and functional use of right UE with grooming and decreased pain with sneezing. She continues with swelling superior to clavicle, spasms upper trapezius, decreased ROM and decreased strength and will therefore benefit from additional physical therapy intervention to achieve maximal function with daily tasks without difficulty.    Rehab Potential Good   PT Frequency 2x / week   PT Duration 6 weeks   PT Treatment/Interventions Electrical Stimulation;Cryotherapy;Moist Heat;Ultrasound;Patient/family education;Neuromuscular re-education;Therapeutic exercise;Manual techniques;Passive range of motion;Dry needling;Iontophoresis 4mg /ml Dexamethasone   PT Next Visit Plan pain control and neromuscular re education, manual techniques for spasms, ROM, therapeutic exercises, iontophoresis   PT Home Exercise Plan posture correction, scapular retraction, pendulums, use of heat/ice; pulleys for ROM; short arc AROM and stabilization supine lying,       Patient will benefit from skilled therapeutic intervention in order to improve the following deficits and impairments:  Decreased strength, Impaired flexibility, Pain, Impaired perceived functional ability, Decreased activity tolerance, Increased muscle spasms, Impaired UE functional use, Decreased range of motion  Visit Diagnosis: Muscle weakness (generalized)  Acute pain of right shoulder  Other muscle spasm     Problem List There are no active problems to display for this patient.   Beacher May PT 08/12/2016, 2:59 PM  [place] [facility] PHYSICAL AND SPORTS MEDICINE [address] [place], [address] Phone: [phone]   Fax:  [phone]  Name: Francenia Chimenti MRN: [id number] Date of Birth: 1965-11-03

## 2016-08-17 ENCOUNTER — Ambulatory Visit: Payer: PRIVATE HEALTH INSURANCE | Admitting: Physical Therapy

## 2016-08-17 ENCOUNTER — Encounter: Payer: Self-pay | Admitting: Physical Therapy

## 2016-08-17 DIAGNOSIS — M25511 Pain in right shoulder: Secondary | ICD-10-CM | POA: Diagnosis not present

## 2016-08-17 DIAGNOSIS — M62838 Other muscle spasm: Secondary | ICD-10-CM

## 2016-08-17 DIAGNOSIS — M6281 Muscle weakness (generalized): Secondary | ICD-10-CM

## 2016-08-17 NOTE — Therapy (Signed)
[place] [facility] PHYSICAL AND SPORTS MEDICINE [address], [place], [address] Phone: [phone]   Fax:  [phone]  Physical Therapy Treatment  Patient Details  Name: Rachel Hinton MRN: [id number] Date of Birth: 27-Mar-1966 Referring Provider: Jones Broom MD  Encounter Date: 08/17/2016      PT End of Session - 08/17/16 1138    Visit Number 7   Number of Visits 12   Date for PT Re-Evaluation 09/08/16   Authorization Type 7   Authorization Time Period 12 WC   PT Start Time 1121   PT Stop Time 1210   PT Time Calculation (min) 49 min   Activity Tolerance Patient tolerated treatment well   Behavior During Therapy [facility] for tasks assessed/performed      Past Medical History:  Diagnosis Date  . 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 CESAREAN SECTION    . SHOULDER ARTHROSCOPY WITH DISTAL CLAVICLE RESECTION Right 07/19/2016   Procedure: SHOULDER ARTHROSCOPY, DEBRIDEMENT PARTIAL ROTATOR CUFF TEAR, SUBACROMIAL DECOMPRESSION, DISTAL CLAVICAL EXCISION;  Surgeon: Jones Broom, MD;  Location: [facility];  Service: Orthopedics;  Laterality: Right;  . SUBACROMIAL DECOMPRESSION Right 07/19/2016   Procedure: SUBACROMIAL DECOMPRESSION;  Surgeon: Jones Broom, MD;  Location: [facility];  Service: Orthopedics;  Laterality: Right;    There were no vitals filed for this visit.      Subjective Assessment - 08/17/16 1126    Subjective Patient reports she was seen by MD yesterday and is to continue with therapy x 4-6 more weeks. She is to return to work tomorrow light duty and she is now taking an anti inflammatory medication.    Limitations Lifting;House hold activities;Other (comment)   Patient Stated Goals To be able to use right arm without pain and back to normal activity   Currently in Pain? Yes   Pain Score 3   with bra pressing on  shoulder   Pain Location Shoulder   Pain Orientation Right   Pain Descriptors / Indicators Throbbing;Sore   Pain Type Acute pain;Surgical pain  07/19/2016   Pain Onset 1 to 4 weeks ago   Pain Frequency Intermittent   Aggravating Factors  moving right arm   Pain Relieving Factors medication, ice, rest   Effect of Pain on Daily Activities moves slower to perform activities for dressing, eating (cutting with knife), pushing, pulling      Objective; New order received to continue PT  Observation; moderate swelling above clavicle, anterior aspect right shoulder, + spasms right upper trapezius AAROM: right shoulder (supine) flexion 0-140, ER 60, IR 60 with pain limiting further motion Palpation; right shoulder with palpable spasms upper trapezius, anterior shoulder with increased tenderness to palpation   Treatment:  Therapeutic exercise: performed with VC, tactile cues and demonstration of therapist Supine lying: AAROM with assistance of therapist forward elevation up to 140degrees with pain and spasms limiting further motion, multiple reps/sets; ER to 60/IR to 60in scapular plane of motion x 10 reps Rhythmic stabilization supine with right UE at 90 degrees 2 sets 5-10 reps mild resistance given, then able to lower without increased pain in shoulder Side lying left: Right shoulder ER 2 x 10 with towel between arm and trunk and guided motion Right shoulder elevation and depression 2  x 10 with tactile and VC Right scapular protraction/retraction 2 x 10 with VC and tactile cues x 10 reps   Modalities: Electrical  stimulation: high volt estim. Muscle spasm clinical protocol:  applied (2) electrodes to upper trapezius and posterior aspect of right shoulder, russian stim. applied to right scapula/lower trapezius muscles 10/10 cycle with intensity to tolerance with ice pack applied to right shoulder with patient seated with right UE supported on pillow x .; no adverse reactions noted:  goal muscle re education, muscle spasm reduction, pain control Iontophoresis with dexamethasone 4mg /ml @ 40 ma*min applied with largeelectrode to anterior aspect of right shoulder in conjunction with estim. ( to apply); no adverse reactions noted    Patient response to treatment: Patient demonstrated improved technique with exercises with minimal VC and assistance for correct alignment. Patient with mild increased soreness in right shoulder with exercises. decreased spasms by 50% with estim.  Improved motor control with repetition and cuing. No adverse reactions to skin following modalities. Much improved with no pain reported at end of session.            PT Education - 08/17/16 1210    Education provided Yes   Education Details HEP re inforced with supine exercises and scapular retraction   Person(s) Educated Patient   Methods Explanation;Demonstration;Tactile cues;Verbal cues   Comprehension Verbalized understanding;Returned demonstration;Verbal cues required;Tactile cues required             PT Long Term Goals - 07/28/16 1126      PT LONG TERM GOAL #1   Title Patient will demonstrate improved function with right UE for daily tasks as indicated by QuickDash score of 20% or less by 09/08/2016   Baseline current 77%   Status New     PT LONG TERM GOAL #2   Title Patient will demonstrate full pain free AROM for overhead and behind back with right UE use for personal care and work related activities by 09/08/2016   Baseline unable to raise right UE without pain limiting motion, passive ROM <90 flexion   Status New     PT LONG TERM GOAL #3   Title Patient will be independent with home program for pain control, exercise for right UE to allow self management once discharged from physical therapy by 09/08/2016   Baseline limited knowledge of appropriate exercises, pain control strategies and progression    Status New               Plan - 08/17/16 1220    Clinical  Impression Statement Patient continues to progress with goals with improved strength demonstrated with exercises and functional use of right UE for personal care at home. she continues with limitations of strength, pain and endurance and will require additional physical therapay ntervention to achieve maximal gains with function in order to return to PLOF.    Rehab Potential Good   PT Frequency 2x / week   PT Duration 6 weeks   PT Treatment/Interventions Electrical Stimulation;Cryotherapy;Moist Heat;Ultrasound;Patient/family education;Neuromuscular re-education;Therapeutic exercise;Manual techniques;Passive range of motion;Dry needling;Iontophoresis 4mg /ml Dexamethasone   PT Next Visit Plan pain control and neromuscular re education, manual techniques for spasms, ROM, therapeutic exercises, iontophoresis   PT Home Exercise Plan posture correction, scapular retraction, pendulums, use of heat/ice; pulleys for ROM; short arc AROM and stabilization supine lying,       Patient will benefit from skilled therapeutic intervention in order to improve the following deficits and impairments:  Decreased strength, Impaired flexibility, Pain, Impaired perceived functional ability, Decreased activity tolerance, Increased muscle spasms, Impaired UE functional use, Decreased range of motion  Visit Diagnosis: Muscle weakness (generalized)  Acute pain of right  shoulder  Other muscle spasm     Problem List There are no active problems to display for this patient.   Beacher MayBrooks, Aayan Haskew PT 08/18/2016, 3:13 PM  [place] [facility] [address] [place], [place], [address] Phone: [phone]   Fax:  [phone]  Name: Rachel Hinton MRN: [id number] Date of Birth: Sep 21, 1965

## 2016-08-19 ENCOUNTER — Ambulatory Visit: Payer: PRIVATE HEALTH INSURANCE | Admitting: Physical Therapy

## 2016-08-19 ENCOUNTER — Encounter: Payer: Self-pay | Admitting: Physical Therapy

## 2016-08-19 DIAGNOSIS — M6281 Muscle weakness (generalized): Secondary | ICD-10-CM

## 2016-08-19 DIAGNOSIS — M25511 Pain in right shoulder: Secondary | ICD-10-CM | POA: Diagnosis not present

## 2016-08-19 DIAGNOSIS — M62838 Other muscle spasm: Secondary | ICD-10-CM

## 2016-08-20 NOTE — Therapy (Signed)
[place] [facility] [address] [place], [place], [address] Phone: [phone]   Fax:  [phone]  Physical Therapy Treatment  Patient Details  Name: Rachel Hinton MRN: [id number] Date of Birth: 09-07-1965 Referring Provider: Jones Broomhandler, Justin MD  Encounter Date: 08/19/2016      PT End of Session - 08/19/16 1028    Visit Number 8   Number of Visits 12   Date for PT Re-Evaluation 09/08/16   Authorization Type 8   Authorization Time Period 12 WC   PT Start Time 1022   PT Stop Time 1119   PT Time Calculation (min) 57 min   Activity Tolerance Patient tolerated treatment well   Behavior During Therapy [facility] for tasks assessed/performed      Past Medical History:  Diagnosis Date  . 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 CESAREAN SECTION    . SHOULDER ARTHROSCOPY WITH DISTAL CLAVICLE RESECTION Right 07/19/2016   Procedure: SHOULDER ARTHROSCOPY, DEBRIDEMENT PARTIAL ROTATOR CUFF TEAR, SUBACROMIAL DECOMPRESSION, DISTAL CLAVICAL EXCISION;  Surgeon: Jones BroomJustin Chandler, MD;  Location: [facility];  Service: Orthopedics;  Laterality: Right;  . SUBACROMIAL DECOMPRESSION Right 07/19/2016   Procedure: SUBACROMIAL DECOMPRESSION;  Surgeon: Jones BroomJustin Chandler, MD;  Location: [facility];  Service: Orthopedics;  Laterality: Right;    There were no vitals filed for this visit.      Subjective Assessment - 08/19/16 1024    Subjective Patient reports she feels weak in right shoulder/back of arm. She is still having increased discomfort with wearing bra strap on her shoulder.   Limitations Lifting;House hold activities;Other (comment)   Patient Stated Goals To be able to use right arm without pain and back to normal activity   Currently in Pain? Yes   Pain Score 3    Pain Location Shoulder   Pain Orientation Right   Pain Descriptors /  Indicators Throbbing;Sore   Pain Type Acute pain;Surgical pain  07/19/16   Pain Onset 1 to 4 weeks ago   Pain Frequency Intermittent      Objective; Observation; mild/moderate swelling above clavicle, anterior aspect right shoulder, + spasms right upper trapezius; all decreased from previous session AAROM: right shoulder (supine) flexion 0-150, ER 70, IR 60 with mild pain/stiffness limiting further motion Palpation; right shoulder with palpable spasms upper trapezius, anterior shoulder with mild tenderness to palpation   Treatment:  Therapeutic exercise:performed with VC, tactile cues and demonstration of therapist Supine lying: AAROM with assistance of therapist forward elevation up to 150degrees with mild discomfort pain and spasmslimiting further motion, multiple reps/sets; ER to 70/IR to 60in scapular plane of motion x 10 reps Rhythmic stabilization supine with right UE at 90 degrees 2 sets 5-10 reps mild resistance given Rhythmic stabilization with right arm at 90 degrees elevation holding 1# weight with rotation CW/CCW x 10 each 3 sets Side lying left: Right shoulder ER 2 x 10 with towel between arm and trunk and guided motion Right shoulder elevation and depression 2  x 10 with tactile and VC Right scapular protraction/retraction 2 x 10 with VC and tactile cues x 10 reps   Modalities: Electrical stimulation:high volt estim. Muscle spasm clinical protocol:  applied (2) electrodes to upper trapezius and posterior aspect of right shoulder, russian stim. applied to right scapula/lower trapezius muscles 10/10 cycle with intensity to tolerance with ice pack applied to right shoulder with patient seated with right UE  supported on pillow x .; no adverse reactions noted: goal muscle re education, muscle spasm reduction, pain control Iontophoresiswith dexamethasone 4mg /ml @ 40 ma*min applied with largeelectrode to anterior aspect of right shoulder in conjunction with  estim. ( to apply); no adverse reactions noted    Patient response to treatment: Patient able to tolerate exercises with only mild pain/discomfort today. Fatigue noted with all exercises. Patient demonstrated improved technique and motor control with repetition. Decreased swelling/tenderness and spasms to mild following modalities.         PT Education - 08/19/16 1110    Education provided Yes   Education Details HEP; shoulder elevation, depression and ER in side lying   Person(s) Educated Patient   Methods Explanation;Demonstration;Tactile cues;Verbal cues   Comprehension Verbalized understanding;Returned demonstration;Verbal cues required;Tactile cues required             PT Long Term Goals - 07/28/16 1126      PT LONG TERM GOAL #1   Title Patient will demonstrate improved function with right UE for daily tasks as indicated by QuickDash score of 20% or less by 09/08/2016   Baseline current 77%   Status New     PT LONG TERM GOAL #2   Title Patient will demonstrate full pain free AROM for overhead and behind back with right UE use for personal care and work related activities by 09/08/2016   Baseline unable to raise right UE without pain limiting motion, passive ROM <90 flexion   Status New     PT LONG TERM GOAL #3   Title Patient will be independent with home program for pain control, exercise for right UE to allow self management once discharged from physical therapy by 09/08/2016   Baseline limited knowledge of appropriate exercises, pain control strategies and progression    Status New               Plan - 08/19/16 1125    Clinical Impression Statement Patient continues to progress with goals with improvement noted with dressing and personal care and decreased cuing to perform exercises with proper alignment and techniqu. She continues with swelling, pain and decreased strength and endurance that limit functional use right UE and will benefit from continued  physical therapy intervention to achieve goals.    Rehab Potential Good   PT Frequency 2x / week   PT Duration 6 weeks   PT Treatment/Interventions Electrical Stimulation;Cryotherapy;Moist Heat;Ultrasound;Patient/family education;Neuromuscular re-education;Therapeutic exercise;Manual techniques;Passive range of motion;Dry needling;Iontophoresis 4mg /ml Dexamethasone   PT Next Visit Plan pain control and neromuscular re education, manual techniques for spasms, ROM, therapeutic exercises, iontophoresis   PT Home Exercise Plan posture correction, scapular retraction, pendulums, use of heat/ice; pulleys for ROM; short arc AROM and stabilization supine lying, side lying ER, scapular control exercises      Patient will benefit from skilled therapeutic intervention in order to improve the following deficits and impairments:  Decreased strength, Impaired flexibility, Pain, Impaired perceived functional ability, Decreased activity tolerance, Increased muscle spasms, Impaired UE functional use, Decreased range of motion  Visit Diagnosis: Muscle weakness (generalized)  Acute pain of right shoulder  Other muscle spasm     Problem List There are no active problems to display for this patient.   Beacher May PT 08/20/2016, 1:32 PM  [place] [facility] PHYSICAL AND SPORTS MEDICINE [address] [place], [address] Phone: [phone]   Fax:  [phone]  Name: Rachel Hinton MRN: [id number] Date of Birth: 1965-12-17

## 2016-08-24 ENCOUNTER — Encounter: Payer: Self-pay | Admitting: Physical Therapy

## 2016-08-24 ENCOUNTER — Ambulatory Visit: Payer: PRIVATE HEALTH INSURANCE | Admitting: Physical Therapy

## 2016-08-24 DIAGNOSIS — M62838 Other muscle spasm: Secondary | ICD-10-CM

## 2016-08-24 DIAGNOSIS — M6281 Muscle weakness (generalized): Secondary | ICD-10-CM

## 2016-08-24 DIAGNOSIS — M25511 Pain in right shoulder: Secondary | ICD-10-CM | POA: Diagnosis not present

## 2016-08-25 NOTE — Therapy (Signed)
[place] [facility] PH[facility]TS MEDICINE [address], [place], [address] Phone: [phone]   Fax:  [phone]  Physical Therapy Treatment  Patient Details  Name: Rachel Hinton MRN: [id number] Date of Birth: [birth year] Referring Provider: Jones Broom MD  Encounter Date: 08/24/2016      PT End of Session - 08/24/16 1215    Visit Number 9   Number of Visits 12   Date for PT Re-Evaluation 09/08/16   Authorization Type 9   Authorization Time Period 12 WC +   PT Start Time 1119   PT Stop Time 1210   PT Time Calculation (min) 51 min   Activity Tolerance Patient tolerated treatment well;Patient limited by fatigue   Behavior During Therapy [facility] for tasks assessed/performed      Past Medical History:  Diagnosis Date  . 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 CESAREAN SECTION    . SHOULDER ARTHROSCOPY WITH DISTAL CLAVICLE RESECTION Right 07/19/2016   Procedure: SHOULDER ARTHROSCOPY, DEBRIDEMENT PARTIAL ROTATOR CUFF TEAR, SUBACROMIAL DECOMPRESSION, DISTAL CLAVICAL EXCISION;  Surgeon: Jones Broom, MD;  Location: [facility];  Service: Orthopedics;  Laterality: Right;  . SUBACROMIAL DECOMPRESSION Right 07/19/2016   Procedure: SUBACROMIAL DECOMPRESSION;  Surgeon: Jones Broom, MD;  Location: [facility];  Service: Orthopedics;  Laterality: Right;    There were no vitals filed for this visit.      Subjective Assessment - 08/24/16 1120    Subjective Patient reports increased pain with reaching out to side and less difficulty with reaching forward. Bra strap still aggravates her shoulder.    Limitations Lifting;House hold activities;Other (comment)  reaching out to side, work related tasks   Patient Stated Goals To be able to use right arm without pain and back to normal activity   Currently in Pain? No/denies  feels tired in right  shoulder      Objective; Observation; moderate swelling above clavicle, anterior aspect right shoulder, + spasms right upper trapezius Palpation; right shoulder with palpable spasms upper trapezius, anterior shoulder with mild tenderness to palpation   Treatment:  Therapeutic exercise:performed with VC, tactile cues and demonstration of therapist Supine lying: AAROM with assistance of therapist forward elevation up to 160degrees with mild discomfort pain and spasmslimiting further motion, multiple reps/sets; ER to 70/IR to 60in scapular plane of motion x 10 reps Rhythmic stabilization supine with right UE at 90 degrees 2 sets 5-10 reps mild/moderate resistance given Rhythmic stabilization for rotations with shoulder in plane of scapula: 3 x 10 reps Shoulder abduction mild resistance given, hold 3 seconds x 10 reps Side lying left: Right shoulder ER 2 x 10 with towel between arm and trunk and guided motion Right shoulder elevation and depression 2 x 10 with tactile and VC Right scapular protraction/retraction 2 x 10 with VC and tactile cues x 10 reps   Modalities: Electrical stimulation:high volt estim. Muscle spasm clinical protocol: applied (2) electrodes to upper trapezius and posterior aspect of right shoulder, russian stim. applied to right scapula/lower trapezius muscles 10/10 cycle with intensity to tolerance with ice pack applied to right shoulder with patient seated with right UE supported on pillow x .; no adverse reactions noted: goal muscle re education, muscle spasm reduction, pain control Iontophoresiswith dexamethasone 4mg /ml @ 40 ma*min applied with largeelectrode to anterior aspect of right shoulder in conjunction with estim. ( to apply); no adverse reactions noted   Patient  response to treatment: Patient able to perform all exercises with good technique with minimal VC and assistance for correct alignment of shoulder. Fatigue noted with all  exercises. Improved motor control with repetition.Patient reported decreased pain and soreness in right shoulder by at least 50% at end of session following modalities          PT Education - 08/24/16 1200    Education provided Yes   Education Details HEP: continue with supine and side lying exercises, use of ice to control pain/swelling   Person(s) Educated Patient   Methods Explanation   Comprehension Verbalized understanding             PT Long Term Goals - 07/28/16 1126      PT LONG TERM GOAL #1   Title Patient will demonstrate improved function with right UE for daily tasks as indicated by QuickDash score of 20% or less by 09/08/2016   Baseline current 77%   Status New     PT LONG TERM GOAL #2   Title Patient will demonstrate full pain free AROM for overhead and behind back with right UE use for personal care and work related activities by 09/08/2016   Baseline unable to raise right UE without pain limiting motion, passive ROM <90 flexion   Status New     PT LONG TERM GOAL #3   Title Patient will be independent with home program for pain control, exercise for right UE to allow self management once discharged from physical therapy by 09/08/2016   Baseline limited knowledge of appropriate exercises, pain control strategies and progression    Status New               Plan - 08/24/16 1215    Clinical Impression Statement Patient is progressing well with therapy intervention with decreasing pain, improved function with personal care and using right UE for work related tasks, within restrictions, exercises and household chores. She conitnues with pain with reaching out to side and with raising arm above shoulder level.    Rehab Potential Good   PT Frequency 2x / week   PT Duration 6 weeks   PT Treatment/Interventions Electrical Stimulation;Cryotherapy;Moist Heat;Ultrasound;Patient/family education;Neuromuscular re-education;Therapeutic exercise;Manual  techniques;Passive range of motion;Dry needling;Iontophoresis 4mg /ml Dexamethasone   PT Next Visit Plan pain control and neromuscular re education, manual techniques for spasms, ROM, therapeutic exercises, iontophoresis   PT Home Exercise Plan posture correction, scapular retraction, use of heat/ice; pulleys for ROM; short arc AROM and stabilization supine lying, side lying ER, scapular control exercises      Patient will benefit from skilled therapeutic intervention in order to improve the following deficits and impairments:  Decreased strength, Impaired flexibility, Pain, Impaired perceived functional ability, Decreased activity tolerance, Increased muscle spasms, Impaired UE functional use, Decreased range of motion  Visit Diagnosis: Muscle weakness (generalized)  Acute pain of right shoulder  Other muscle spasm     Problem List There are no active problems to display for this patient.   Beacher MayBrooks, Marie PT 08/25/2016, 2:39 PM  [place] [facility] [address] [place], [place], [address] Phone: [phone]   Fax:  [phone]  Name: Rachel Hinton MRN: [id number] Date of Birth: [birth year]

## 2016-08-27 ENCOUNTER — Encounter: Payer: Self-pay | Admitting: Physical Therapy

## 2016-08-27 ENCOUNTER — Ambulatory Visit: Payer: PRIVATE HEALTH INSURANCE | Attending: Orthopedic Surgery | Admitting: Physical Therapy

## 2016-08-27 DIAGNOSIS — M25511 Pain in right shoulder: Secondary | ICD-10-CM | POA: Insufficient documentation

## 2016-08-27 DIAGNOSIS — M62838 Other muscle spasm: Secondary | ICD-10-CM | POA: Insufficient documentation

## 2016-08-27 DIAGNOSIS — M6281 Muscle weakness (generalized): Secondary | ICD-10-CM | POA: Insufficient documentation

## 2016-08-27 NOTE — Therapy (Signed)
[facility] [address] [place], [place], [address] Phone: [phone]   Fax:  [phone]  Physical Therapy Treatment  Patient Details  Name: Rachel Hinton MRN: [id number] Date of Birth: January 25, 1966 Referring Provider: Jones Broomhandler, Justin MD  Encounter Date: 08/27/2016      PT End of Session - 08/27/16 1010    Visit Number 10   Number of Visits 12   Date for PT Re-Evaluation 09/08/16   Authorization Type 10   Authorization Time Period 12 WC +   PT Start Time 1004   PT Stop Time 1100   PT Time Calculation (min) 56 min   Activity Tolerance Patient tolerated treatment well;Patient limited by pain   [facility]      Past Medical History:  Diagnosis Date  . 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 CESAREAN SECTION    . SHOULDER ARTHROSCOPY WITH DISTAL CLAVICLE RESECTION Right 07/19/2016   Procedure: SHOULDER ARTHROSCOPY, DEBRIDEMENT PARTIAL ROTATOR CUFF TEAR, SUBACROMIAL DECOMPRESSION, DISTAL CLAVICAL EXCISION;  Surgeon: Jones BroomJustin Chandler, MD;  Location: [facility];  Service: Orthopedics;  Laterality: Right;  . SUBACROMIAL DECOMPRESSION Right 07/19/2016   Procedure: SUBACROMIAL DECOMPRESSION;  Surgeon: Jones BroomJustin Chandler, MD;  Location: [facility];  Service: Orthopedics;  Laterality: Right;    There were no vitals filed for this visit.      Subjective Assessment - 08/27/16 1007    Subjective Patient reports increased pain with exercises at home with side lying shoulder elevation; aggravated [place] joint and increased swelling in front of right shoulder /chest area. Bra strap still aggravates her shoulder.    Limitations Lifting;House hold activities;Other (comment)  reaching out to side, work related tasks   Patient Stated Goals To be able to use right arm without pain and back to  normal activity   Currently in Pain? Yes   Pain Score 2    Pain Location Shoulder  right  joint   Pain Orientation Right   Pain Descriptors / Indicators Aching   Pain Type Acute pain;Surgical pain  07/19/2016   Pain Onset More than a month ago   Pain Frequency Intermittent   Aggravating Factors  shrugging right shoulder with exercise lying on left side      Observation; Moderated swelling above right clavicle and anterior aspect right shoulder, + spasm noted right upper trapezius.  Palpation; right shoulder with palpable spasms upper trapezius, anterior shoulder with mildtenderness to palpation   Treatment: Manual therapy: STM performed with superficial technique and gentle stretching along anterior aspect right shoulder/pectroal muscles with patient supine lying in conjunction with exercises: goal: pain, spasms  Therapeutic exercise:performed with VC, tactile cues and demonstration of therapist Supine lying: AAROM with assistance of therapist forward elevation up to 160degrees with mild discomfortpain and spasmslimiting further motion, multiple reps/sets; ER to 70/IR to 60in scapular plane of motion x 10 reps Rhythmic stabilization supine with right UE at 90 degrees 2 sets 5-10 reps mild/moderate resistance given Rhythmic stabilization for rotations with shoulder in plane of scapula: 3 x 10 reps Shoulder abduction mild resistance given, hold 3 seconds x 10 reps  Modalities: Electrical stimulation:high volt estim. Muscle spasm clinical protocol: applied (2) electrodes to upper trapezius and posterior aspect of right shoulder, russian stim. applied to right scapula/lower trapezius muscles 10/10 cycle with intensity to tolerance with ice pack applied to right shoulder with  patient seated with right UE supported on pillow x .; no adverse reactions noted: goal muscle re education, muscle spasm reduction, pain control Iontophoresiswith dexamethasone 4mg /ml @ 40 ma*min  applied with largeelectrode to anterior aspect of right shoulder in conjunction with estim. ( to apply); no adverse reactions noted   Patient response to treatment: Patient with decreased pain and tenderness with improved soft tissue elasticity by at least 50% in anterior aspect of right shoulder/pectoral muscles with STM. Patient able to perform exercises with guidance and modified resistance. Improved pain level to <1/10 at end of session following modalities.        PT Education - 08/27/16 1009    Education provided Yes   Education Details rest to alleviate symptoms in right [place] joint, use of ice/heat to assist with healing   Person(s) Educated Patient   Methods Explanation   Comprehension Verbalized understanding             PT Long Term Goals - 07/28/16 1126      PT LONG TERM GOAL #1   Title Patient will demonstrate improved function with right UE for daily tasks as indicated by QuickDash score of 20% or less by 09/08/2016   Baseline current 77%   Status New     PT LONG TERM GOAL #2   Title Patient will demonstrate full pain free AROM for overhead and behind back with right UE use for personal care and work related activities by 09/08/2016   Baseline unable to raise right UE without pain limiting motion, passive ROM <90 flexion   Status New     PT LONG TERM GOAL #3   Title Patient will be independent with home program for pain control, exercise for right UE to allow self management once discharged from physical therapy by 09/08/2016   Baseline limited knowledge of appropriate exercises, pain control strategies and progression    Status New               Plan - 08/27/16 1010    Clinical Impression Statement Patient presents with incresaed fatigue and slower motion to raise arm above shoulder and [place] joint aching today which limits exercises performed. She responded well to treatment and was able to raise arm above shoulder level with minimal to no difficulty  following treatment. She continues with limitations of pain, decreased ROM and strength and will therefore benefit from continued physical therapy intervention.    Rehab Potential Good   PT Frequency 2x / week   PT Duration 6 weeks   PT Treatment/Interventions Electrical Stimulation;Cryotherapy;Moist Heat;Ultrasound;Patient/family education;Neuromuscular re-education;Therapeutic exercise;Manual techniques;Passive range of motion;Dry needling;Iontophoresis 4mg /ml Dexamethasone   PT Next Visit Plan pain control and neromuscular re education, manual techniques for spasms, ROM, therapeutic exercises, iontophoresis   PT Home Exercise Plan posture correction, scapular retraction, use of heat/ice; pulleys for ROM; short arc AROM and stabilization supine lying, side lying ER, scapular control exercises      Patient will benefit from skilled therapeutic intervention in order to improve the following deficits and impairments:  Decreased strength, Impaired flexibility, Pain, Impaired perceived functional ability, Decreased activity tolerance, Increased muscle spasms, Impaired UE functional use, Decreased range of motion  Visit Diagnosis: Muscle weakness (generalized)  Acute pain of right shoulder  Other muscle spasm     Problem List There are no active problems to display for this patient.   Beacher May PT 08/28/2016, 4:05 PM  [facility] [address] [place],  [place], [address] Phone: [phone]   Fax:  [phone]  Name: Rachel Hinton MRN: [id number] Date of Birth: October 22, 1965

## 2016-08-30 ENCOUNTER — Ambulatory Visit: Payer: PRIVATE HEALTH INSURANCE | Admitting: Physical Therapy

## 2016-08-30 ENCOUNTER — Encounter: Payer: Self-pay | Admitting: Physical Therapy

## 2016-08-30 DIAGNOSIS — M6281 Muscle weakness (generalized): Secondary | ICD-10-CM | POA: Diagnosis not present

## 2016-08-30 DIAGNOSIS — M25511 Pain in right shoulder: Secondary | ICD-10-CM

## 2016-08-30 DIAGNOSIS — M62838 Other muscle spasm: Secondary | ICD-10-CM

## 2016-08-30 NOTE — Therapy (Signed)
[facility] [address] [place], [place], [address] Phone: [phone]   Fax:  [phone]  Physical Therapy Treatment  Patient Details  Name: Rachel Hinton MRN: [id number] Date of Birth: [birth year] Referring Provider: Jones Broomhandler, Justin MD  Encounter Date: 08/30/2016      PT End of Session - 08/30/16 1250    Visit Number 11   Number of Visits 12   Date for PT Re-Evaluation 09/08/16   Authorization Type 11   Authorization Time Period 12 WC +   PT Start Time 1120   PT Stop Time 1210   PT Time Calculation (min) 50 min   Activity Tolerance Patient tolerated treatment well;Patient limited by pain   Behavior During Therapy [facility] for tasks assessed/performed      Past Medical History:  Diagnosis Date  . 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 CESAREAN SECTION    . SHOULDER ARTHROSCOPY WITH DISTAL CLAVICLE RESECTION Right 07/19/2016   Procedure: SHOULDER ARTHROSCOPY, DEBRIDEMENT PARTIAL ROTATOR CUFF TEAR, SUBACROMIAL DECOMPRESSION, DISTAL CLAVICAL EXCISION;  Surgeon: Jones BroomJustin Chandler, MD;  Location: [facility];  Service: Orthopedics;  Laterality: Right;  . SUBACROMIAL DECOMPRESSION Right 07/19/2016   Procedure: SUBACROMIAL DECOMPRESSION;  Surgeon: Jones BroomJustin Chandler, MD;  [facility];  Service: Orthopedics;  Laterality: Right;    There were no vitals filed for this visit.      Subjective Assessment - 08/30/16 1122    Subjective Patient reports she is seeing improvement with therapy with less swelling in right shoulder with iontophoresis and is able to exercise with less difficulty.    Limitations Lifting;House hold activities;Other (comment)  reaching out to side, work related tasks   Patient Stated Goals To be able to use right arm without pain and back to normal activity   Currently in Pain? Yes   Pain Score  2    Pain Location Shoulder   Pain Orientation Right   Pain Descriptors / Indicators Aching;Tightness   Pain Type Acute pain;Surgical pain  07/19/2016   Pain Onset More than a month ago   Pain Frequency Intermittent       Observation; mild to moderate swelling above right clavicle and anterior aspect right shoulder, + spasm noted right upper trapezius.  Palpation; right shoulder with palpable spasms upper trapezius, anterior shoulder with mildtenderness to palpation   Treatment: Manual therapy: STM performed with superficial technique and gentle stretching along anterior aspect right shoulder/pectroal muscles with patient supine lying in conjunction with exercises: goal: pain, spasms  Therapeutic exercise:performed with VC, tactile cues and demonstration of therapist Supine lying: AAROM with assistance of therapist forward elevation up to 160degrees with mild discomfortpain and spasmslimiting further motion, multiple reps/sets Rhythmic stabilization supine with right UE at 90 degrees 2 sets 5-10 reps mild/moderateresistance given Rhythmic stabilization for rotations with shoulder in plane of scapula: 3 x 10 reps Shoulder abduction mild resistance given, hold 3 seconds x 10 reps Standing scaption at wall x 15 reps each with demonstration and VC  Modalities: Electrical stimulation:high volt estim. Muscle spasm clinical protocol: applied (2) electrodes to upper trapezius and posterior aspect of right shoulder, russian stim. applied to right scapula/lower trapezius muscles 10/10 cycle with intensity to tolerance with ice pack applied to right shoulder with patient seated with right UE supported on pillow x 20min.; no adverse reactions noted: goal muscle re education, muscle spasm reduction, pain control Iontophoresiswith  dexamethasone 4mg /ml @ 40 ma*min applied with largeelectrode to anterior aspect of right shoulder in conjunction with estim. ( to apply); no adverse  reactions noted   Patient response to treatment: Patient demonstrated improved ROM with decreased tenderness and pain and improved soft tissue elasticity by at least 50% with STM. Patient performed exercises with guidance and graded resistance.           PT Education - 08/30/16 1249    Education provided Yes   Education Details HEP; added scaption at wall   Person(s) Educated Patient   Methods Explanation;Demonstration;Verbal cues;Handout   Comprehension Verbalized understanding;Returned demonstration;Verbal cues required             PT Long Term Goals - 07/28/16 1126      PT LONG TERM GOAL #1   Title Patient will demonstrate improved function with right UE for daily tasks as indicated by QuickDash score of 20% or less by 09/08/2016   Baseline current 77%   Status New     PT LONG TERM GOAL #2   Title Patient will demonstrate full pain free AROM for overhead and behind back with right UE use for personal care and work related activities by 09/08/2016   Baseline unable to raise right UE without pain limiting motion, passive ROM <90 flexion   Status New     PT LONG TERM GOAL #3   Title Patient will be independent with home program for pain control, exercise for right UE to allow self management once discharged from physical therapy by 09/08/2016   Baseline limited knowledge of appropriate exercises, pain control strategies and progression    Status New               Plan - 08/30/16 1215    Clinical Impression Statement Patient demonstrated improved abiltiy to perform exercises in supine with assistance and guided exercises. She continues with pain, swelling and spasms that limit full functional use of right UE without difficlulty and will benefit from continued phyiscal therapy intervention to achieve goals.    Rehab Potential Good   PT Frequency 2x / week   PT Duration 6 weeks   PT Treatment/Interventions Electrical Stimulation;Cryotherapy;Moist  Heat;Ultrasound;Patient/family education;Neuromuscular re-education;Therapeutic exercise;Manual techniques;Passive range of motion;Dry needling;Iontophoresis 4mg /ml Dexamethasone   PT Next Visit Plan pain control and neromuscular re education, manual techniques for spasms, ROM, therapeutic exercises, iontophoresis   PT Home Exercise Plan posture correction, scapular retraction, use of heat/ice; pulleys for ROM; short arc AROM and stabilization supine lying, side lying ER, scapular control exercises      Patient will benefit from skilled therapeutic intervention in order to improve the following deficits and impairments:  Decreased strength, Impaired flexibility, Pain, Impaired perceived functional ability, Decreased activity tolerance, Increased muscle spasms, Impaired UE functional use, Decreased range of motion  Visit Diagnosis: Muscle weakness (generalized)  Acute pain of right shoulder  Other muscle spasm     Problem List There are no active problems to display for this patient.   Beacher May PT 08/31/2016, 6:41 PM  [facility] [address], [place], [address] Phone: [phone]   Fax:  [phone]  Name: Rachel Hinton MRN: [id number] Date of Birth: 19-Oct-1965

## 2016-09-02 ENCOUNTER — Encounter: Payer: Self-pay | Admitting: Physical Therapy

## 2016-09-02 ENCOUNTER — Ambulatory Visit: Payer: PRIVATE HEALTH INSURANCE | Admitting: Physical Therapy

## 2016-09-02 DIAGNOSIS — M62838 Other muscle spasm: Secondary | ICD-10-CM

## 2016-09-02 DIAGNOSIS — M6281 Muscle weakness (generalized): Secondary | ICD-10-CM

## 2016-09-02 DIAGNOSIS — M25511 Pain in right shoulder: Secondary | ICD-10-CM

## 2016-09-03 NOTE — Therapy (Signed)
[facility] [address] [place], [place], [address] Phone: [phone]   Fax:  [phone]  Physical Therapy Treatment  Patient Details  Name: Rachel Mapleslejandra Lumley MRN: [id number] Date of Birth: 06-28-1966 Referring Provider: Jones Broomhandler, Justin MD  Encounter Date: 09/02/2016      PT End of Session - 09/02/16 1300    Visit Number 12   Number of Visits 12   Date for PT Re-Evaluation 09/08/16   Authorization Type 12   Authorization Time Period 12 WC +   PT Start Time 1122   PT Stop Time 1213   PT Time Calculation (min) 51 min   Activity Tolerance Patient tolerated treatment well;Patient limited by pain   Behavior [facility] Therapy [facility] tasks assessed/performed      Past Medical History:  Diagnosis Date  . 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 CESAREAN SECTION    . SHOULDER ARTHROSCOPY WITH DISTAL CLAVICLE RESECTION Right 07/19/2016   Procedure: SHOULDER ARTHROSCOPY, DEBRIDEMENT PARTIAL ROTATOR CUFF TEAR, SUBACROMIAL DECOMPRESSION, DISTAL CLAVICAL EXCISION;  Surgeon: Jones BroomJustin Chandler, MD;  Location: [facility];  Service: Orthopedics;  Laterality: Right;  . SUBACROMIAL DECOMPRESSION Right 07/19/2016   Procedure: SUBACROMIAL DECOMPRESSION;  Surgeon: Jones BroomJustin Chandler, MD;  Location: [facility];  Service: Orthopedics;  Laterality: Right;    There were no vitals filed for this visit.      Subjective Assessment - 09/02/16 1125    Subjective Able to raise arm above shoulder level and lower cautiously  without pain now. She reports feeling pressure, pulling? Under armpit, better following STM in therapy.   Limitations Lifting;House hold activities;Other (comment)  reaching out to side, work related tasks   Patient Stated Goals To be able to use right arm without pain and back to normal activity   Currently in Pain? No/denies   muslce sorenesss only        Observation; mild swelling above right clavicle and anterior aspect right shoulder  Palpation; right shoulder with palpable spasms upper trapezius, anterior shoulde/pectoral muscles and subsapularis/lats. with mildtenderness to palpation AROM; right shoulder 0-145 degrees forward elevation pre treatment sitting   Treatment: Manual therapy: STM performed with superficial technique and gentle stretching along anterior aspect right shoulder/pectroal muscles with patient supine lying in conjunction with exercises: goal: pain, spasms  Therapeutic exercise:performed with VC, tactile cues and demonstration of therapist Supine lying: AAROM with assistance of therapist forward elevation up to 150degrees with mild discomfortpain and spasmslimiting further motion, multiple reps/sets Rhythmic stabilization supine with right UE at 90 degrees 2 sets 5-10 reps mild/moderateresistance given Rhythmic stabilization for rotations with shoulder in plane of scapula: 3 x 10 reps Shoulder abduction mild resistance given, hold 3 seconds x 10 reps Standing scaption at wall x 15 reps each with demonstration and VC  Modalities: Electrical stimulation:high volt estim. Muscle spasm clinical protocol: applied (2) electrodes to upper trapezius and posterior aspect of right shoulder, russian stim. applied to right scapula/lower trapezius muscles 10/10 cycle with intensity to tolerance with ice pack applied to right shoulder with patient seated with right UE supported on pillow x 20min.; no adverse reactions noted: goal muscle re education, muscle spasm reduction, pain control Iontophoresiswith dexamethasone 4mg /ml @ 40 ma*min applied with largeelectrode to anterior aspect of right shoulder in conjunction with estim. (10min to apply); no adverse reactions noted       Patient response to treatment:  Patient demonstrated improved AROM with decreased pain and able to lower  with controlled motion following treatment with reports of no pulling sensation under armpit. Patient with improved soft tissue     elasticity by 50% right shoulder girdle/pectoral and subscapularis/lats. following STM.         PT Education - 09/02/16 1200    Education provided Yes   Education Details HEP; standing exercises at wall, scaption, abduction through partial ROM   Person(s) Educated Patient   Methods Explanation;Demonstration;Verbal cues   Comprehension Verbalized understanding;Returned demonstration;Verbal cues required             PT Long Term Goals - 07/28/16 1126      PT LONG TERM GOAL #1   Title Patient will demonstrate improved function with right UE for daily tasks as indicated by QuickDash score of 20% or less by 09/08/2016   Baseline current 77%   Status New     PT LONG TERM GOAL #2   Title Patient will demonstrate full pain free AROM for overhead and behind back with right UE use for personal care and work related activities by 09/08/2016   Baseline unable to raise right UE without pain limiting motion, passive ROM <90 flexion   Status New     PT LONG TERM GOAL #3   Title Patient will be independent with home program for pain control, exercise for right UE to allow self management once discharged from physical therapy by 09/08/2016   Baseline limited knowledge of appropriate exercises, pain control strategies and progression    Status New               Plan - 09/02/16 1310    Clinical Impression Statement Patient is progressing well towards goals with improved AROM with decreased pain in right shoulder which allows improved function with dressing, personal care tasks including hair care. She continues with swelling, weakness and decreased endurance for using right UE and will therefore benefit from additional physical therapy intervention to achieve goals.     Rehab Potential Good   PT Frequency 2x / week   PT Duration 6 weeks   PT  Treatment/Interventions Electrical Stimulation;Cryotherapy;Moist Heat;Ultrasound;Patient/family education;Neuromuscular re-education;Therapeutic exercise;Manual techniques;Passive range of motion;Dry needling;Iontophoresis 4mg /ml Dexamethasone   PT Next Visit Plan pain control and neromuscular re education, manual techniques for spasms, ROM, therapeutic exercises, iontophoresis   PT Home Exercise Plan posture correction, scapular retraction, use of heat/ice; pulleys for ROM; short arc AROM and stabilization supine lying, side lying ER, scapular control exercises      Patient will benefit from skilled therapeutic intervention in order to improve the following deficits and impairments:  Decreased strength, Impaired flexibility, Pain, Impaired perceived functional ability, Decreased activity tolerance, Increased muscle spasms, Impaired UE functional use, Decreased range of motion  Visit Diagnosis: Muscle weakness (generalized)  Acute pain of right shoulder  Other muscle spasm     Problem List There are no active problems to display for this patient.   Beacher May PT 09/03/2016, 1:13 PM  [place] [facility] [address] [place], [address] Phone: [phone]   Fax:  [phone]  Name: Fanchon Papania MRN: [id number] Date of Birth: January 21, 1966

## 2016-09-06 ENCOUNTER — Ambulatory Visit: Payer: PRIVATE HEALTH INSURANCE | Admitting: Physical Therapy

## 2016-09-06 ENCOUNTER — Encounter: Payer: Self-pay | Admitting: Physical Therapy

## 2016-09-06 DIAGNOSIS — M62838 Other muscle spasm: Secondary | ICD-10-CM

## 2016-09-06 DIAGNOSIS — M6281 Muscle weakness (generalized): Secondary | ICD-10-CM | POA: Diagnosis not present

## 2016-09-06 DIAGNOSIS — M25511 Pain in right shoulder: Secondary | ICD-10-CM

## 2016-09-06 NOTE — Therapy (Signed)
[place] [facility] [address], [place], [address] Phone: [phone]   Fax:  [phone]  Physical Therapy Treatment  Patient Details  Name: Rachel Hinton MRN: [id number] Date of Birth: [birth year] Referring Provider: Jones Broom MD  Encounter Date: 09/06/2016      PT End of Session - 09/06/16 0923    Visit Number 12   Number of Visits 20   Date for PT Re-Evaluation 10/04/16   Authorization Type 12   Authorization Time Period 12 WC +   PT Start Time 0914   PT Stop Time 1007   PT Time Calculation (min) 53 min   Activity Tolerance Patient tolerated treatment well;Patient limited by pain   Behavior During Therapy [facility] for tasks assessed/performed      Past Medical History:  Diagnosis Date  . 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 CESAREAN SECTION    . SHOULDER ARTHROSCOPY WITH DISTAL CLAVICLE RESECTION Right 07/19/2016   Procedure: SHOULDER ARTHROSCOPY, DEBRIDEMENT PARTIAL ROTATOR CUFF TEAR, SUBACROMIAL DECOMPRESSION, DISTAL CLAVICAL EXCISION;  Surgeon: Jones Broom, MD;  Location: [facility];  Service: Orthopedics;  Laterality: Right;  . SUBACROMIAL DECOMPRESSION Right 07/19/2016   Procedure: SUBACROMIAL DECOMPRESSION;  Surgeon: Jones Broom, MD;  Location: [facility];  Service: Orthopedics;  Laterality: Right;    There were no vitals filed for this visit.      Subjective Assessment - 09/06/16 0922    Subjective Patient reports decreased feeling of fullness and pulling under armpit following previous session. she continues to see improvement with abiltiy to use right UE overhead without pain; she continues to feel weak.    Limitations Lifting;House hold activities  reaching out to side, work related tasks   Patient Stated Goals To be able to use right arm without pain and back to normal activity    Currently in Pain? No/denies       Observation; mildswelling above right clavicle and anterior aspect right shoulder, mild spasms noted right upper trapezius Palpation; right shoulder with palpable spasms upper trapezius, subsapularis/lats. with mildtenderness to palpation AROM; right shoulder 0-165 degrees forward elevation, IR behind  Back to rear hip pocket pre treatment sitting Patient with difficulty crossing right UE to opposite shoulder Strength decreased ER, flexion, elbow extension. 4-/5.  Outcome measure: QuickDash score 40% (moderate self perceived disability)   Treatment: Manual therapy: STM performed with superficial technique to right shoulder subscapularis, latissimus muscleswith patient supine lying in conjunction with exercises: goal: pain, spasms  Therapeutic exercise:performed with VC, tactile cues and demonstration of therapist Supine lying: right shoulder/UE exercises:  AAROM right shoulder with assistance of therapist forward elevation up to 165 degrees and full ER/IR with mild discomfort pain limiting further motion, multiple reps/sets Rhythmic stabilization supine with right UE at 90 degrees 2 sets 5-10 reps mild/moderate manualresistance given Rhythmic stabilization for rotations with shoulder in plane of scapula: 3 x 10 reps; mild to moderate manual resistance given Bilateral forward flexion overhead with 5# weight x 15 reps for AAROM, strengthening  Side lying left: Patient performed right shoulder ER with 2# weight x 15 reps with guided motion and VC and shoulder abduction with assistance x 10 reps  Standing: scaption, bilateral shoulder abduction and ER at wall x 15 reps each with demonstration and VC Instructed in scapular retraction with red resistive band x 10 reps high and shoulder extension to hips x 10  Modalities: Electrical stimulation:high volt estim. Muscle spasm clinical protocol: applied (2) electrodes to upper trapezius and  posterior aspect of right shoulder, russian stim. applied to right scapula/lower trapezius muscles 10/10 cycle with intensity to tolerance with ice pack applied to right shoulder with patient seated with right UE supported on pillow x 20min.; no adverse reactions noted: goal muscle re education, muscle spasm reduction, pain control Iontophoresiswith dexamethasone 4mg /ml @ 40 ma*min applied with largeelectrode to anterior aspect of right shoulder just inferior to clavicle, in conjunction with estim. (10min to apply); no adverse reactions noted       Patient response to treatment: Patient demonstrated improved AAROM with mild soreness, no increased pain during treatment. She performed exercises with minimal VC and demonstration for good technique and alignment.                 Improved muscle contraction noted with estim, no adverse reactions noted following iontophoresis and cold pack.        PT Education - 09/06/16 1010    Education provided Yes   Education Details HEP: added resistive band scapular retraciton and shoulder extension, side lying ER   Person(s) Educated Patient   Methods Explanation;Demonstration;Verbal cues   Comprehension Verbalized understanding;Returned demonstration;Verbal cues required             PT Long Term Goals - 09/06/16 1024      PT LONG TERM GOAL #1   Title Patient will demonstrate improved function with right UE for daily tasks as indicated by QuickDash score of 20% or less by 10/04/2016   Baseline initially 77%; crrent 09/06/16 40%   Status Revised     PT LONG TERM GOAL #2   Title Patient will demonstrate full pain free AROM for overhead and behind back with right UE use for personal care and work related activities by 10/04/2016   Baseline initially: unable to raise right UE without pain limiting motion, passive ROM <90 flexion; current 09/06/16 flexion cautious movement 165, behind back to hip pocket   Status Revised     PT LONG TERM GOAL #3    Title Patient will be independent with home program for pain control, exercise for right UE to allow self management once discharged from physical therapy by 10/04/2016   Baseline limited knowledge of appropriate exercises, pain control strategies and progression, requires guidance and assistance   Status Revised               Plan - 09/06/16 0924    Clinical Impression Statement Patient demonstrates improvement with right shoulder AROM and strength which is allowing her to perform more funcitonal activities with less difficulty at home and work. She continues with limitations of ROM, strength and endurance and will benefit from continued physical therapy intervention to achieve maximal return and function.    Rehab Potential Good   PT Frequency 2x / week   PT Duration 4 weeks   PT Treatment/Interventions Electrical Stimulation;Cryotherapy;Moist Heat;Ultrasound;Patient/family education;Neuromuscular re-education;Therapeutic exercise;Manual techniques;Passive range of motion;Dry needling;Iontophoresis 4mg /ml Dexamethasone   PT Next Visit Plan pain control and neromuscular re education, manual techniques for spasms, ROM, therapeutic exercises, iontophoresis   PT Home Exercise Plan posture correction, scapular retraction with use of resistive bands, use of heat/ice; pulleys for ROM; stabilization supine lying, side lying ER, standing scaption, abduction and ER at wall   Consulted and Agree with Plan of Care Patient      Patient will benefit from skilled therapeutic intervention in order to improve the following deficits  and impairments:  Decreased strength, Impaired flexibility, Pain, Impaired perceived functional ability, Decreased activity tolerance, Increased muscle spasms, Impaired UE functional use, Decreased range of motion  Visit Diagnosis: Muscle weakness (generalized) - Plan: PT plan of care cert/re-cert  Acute pain of right shoulder - Plan: PT plan of care cert/re-cert  Other  muscle spasm - Plan: PT plan of care cert/re-cert     Problem List There are no active problems to display for this patient.   Beacher May PT 09/06/2016, 3:47 PM  [facility] PH[facility] SPORTS MEDICINE [address] [place], [address] Phone: [phone]   Fax:  [phone]  Name: Rocklyn Mayberry MRN: [id number] Date of Birth: 10-09-1965

## 2016-09-09 ENCOUNTER — Ambulatory Visit: Payer: PRIVATE HEALTH INSURANCE | Admitting: Physical Therapy

## 2016-09-09 ENCOUNTER — Encounter: Payer: Self-pay | Admitting: Physical Therapy

## 2016-09-09 DIAGNOSIS — M62838 Other muscle spasm: Secondary | ICD-10-CM

## 2016-09-09 DIAGNOSIS — M25511 Pain in right shoulder: Secondary | ICD-10-CM

## 2016-09-09 DIAGNOSIS — M6281 Muscle weakness (generalized): Secondary | ICD-10-CM | POA: Diagnosis not present

## 2016-09-09 NOTE — Therapy (Signed)
[place] [facility] [address] [place], [place], [address] Phone: [phone]   Fax:  [phone]  Physical Therapy Treatment  Patient Details  Name: Rachel Hinton MRN: [id number] Date of Birth: February 24, 1966 Referring Provider: Jones Broomhandler, Justin MD  Encounter Date: 09/09/2016      PT End of Session - 09/09/16 1127    Visit Number 13   Number of Visits 20   Date for PT Re-Evaluation 10/04/16   Authorization Type 13   Authorization Time Period 12 WC +   PT Start Time 1125   PT Stop Time 1232   PT Time Calculation (min) 67 min   Activity Tolerance Patient tolerated treatment well;Patient limited by pain   Behavior During Therapy [facility] for tasks assessed/performed      Past Medical History:  Diagnosis Date  . 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 CESAREAN SECTION    . SHOULDER ARTHROSCOPY WITH DISTAL CLAVICLE RESECTION Right 07/19/2016   Procedure: SHOULDER ARTHROSCOPY, DEBRIDEMENT PARTIAL ROTATOR CUFF TEAR, SUBACROMIAL DECOMPRESSION, DISTAL CLAVICAL EXCISION;  Surgeon: Jones BroomJustin Chandler, MD;  Location: [facility];  Service: Orthopedics;  Laterality: Right;  . SUBACROMIAL DECOMPRESSION Right 07/19/2016   Procedure: SUBACROMIAL DECOMPRESSION;  Surgeon: Jones BroomJustin Chandler, MD;  Location: [facility];  Service: Orthopedics;  Laterality: Right;    There were no vitals filed for this visit.      Subjective Assessment - 09/09/16 1126    Subjective Patient reports pain in lateral aspect of right shoulder and feels stretching across to opposite shoulder has aggravated her.    Limitations Lifting;House hold activities  reaching out to side, work related tasks   Patient Stated Goals To be able to use right arm without pain and back to normal activity   Currently in Pain? Yes   Pain Score 3    Pain Location Shoulder   Pain  Orientation Right   Pain Descriptors / Indicators Aching   Pain Type Acute pain;Surgical pain  07/19/2016   Pain Onset More than a month ago   Pain Frequency Intermittent             Objective:          Palpation: increased spasms and tenderness right upper trapezius, teres, subscapularis and latissimus muscles   Treatment: Manual therapy: STM performed with superficial technique to right shoulder upper trapezius, subscapularis, latissimus muscleswith patient supine lying in conjunction with exercises: goal: pain, spasms  Therapeutic exercise:performed with VC, tactile cues and demonstration of therapist Supine lying: right shoulder/UE exercises:  AAROM right shoulder with assistance of therapist forward elevation up to 165 degrees and full ER/IR with mild discomfort pain limiting further motion, multiple reps/sets Rhythmic stabilization supine with right UE at 90 degrees 2 sets 5-10 reps mild/moderate manualresistance given Rhythmic stabilization for rotations with shoulder in plane of scapula: 3 x 10 reps; mild to moderate manual resistance given  Side lying left: Patient performed right shoulder ER with 2# weight x 15 reps with guided motion and VC and shoulder abduction with assistance x 10 reps  Modalities: Electrical stimulation:high volt estim. Muscle spasm clinical protocol: applied (2) electrodes to upper trapezius and posterior aspect of right shoulder, russian stim. applied to right scapula/lower trapezius muscles 10/10 cycle with intensity to tolerance with ice pack applied to right shoulder with patient seated with right UE supported on pillow x 20min.; no adverse reactions noted: goal  muscle re education, muscle spasm reduction, pain control Iontophoresiswith dexamethasone 4mg /ml @ 40 ma*min applied with largeelectrode to anterior aspect of right shoulder just inferior to clavicle, in conjunction with estim. ( to apply); no adverse reactions noted            Patient response to treatment: patient demonstrated improved technique with exercises with         minimal VC for correct alignment. Patient with decreased pain from  3 /10 to 1/10. Patient with decreased spasms by 50% following         STM.             PT Long Term Goals - 09/06/16 1024      PT LONG TERM GOAL #1   Title Patient will demonstrate improved function with right UE for daily tasks as indicated by QuickDash score of 20% or less by 10/04/2016   Baseline initially 77%; crrent 09/06/16 40%   Status Revised     PT LONG TERM GOAL #2   Title Patient will demonstrate full pain free AROM for overhead and behind back with right UE use for personal care and work related activities by 10/04/2016   Baseline initially: unable to raise right UE without pain limiting motion, passive ROM <90 flexion; current 09/06/16 flexion cautious movement 165, behind back to hip pocket   Status Revised     PT LONG TERM GOAL #3   Title Patient will be independent with home program for pain control, exercise for right UE to allow self management once discharged from physical therapy by 10/04/2016   Baseline limited knowledge of appropriate exercises, pain control strategies and progression, requires guidance and assistance   Status Revised               Plan - 09/09/16 1128    Clinical Impression Statement Patient is progressing with improvement with functional use of right UE. She continues with weakness and stiffness in her right shoulder and requires soft tissue mobilization and guidance for strengthening exercises in order to return to prior level of function.     Rehab Potential Good   PT Frequency 2x / week   PT Duration 4 weeks   PT Treatment/Interventions Electrical Stimulation;Cryotherapy;Moist Heat;Ultrasound;Patient/family education;Neuromuscular re-education;Therapeutic exercise;Manual techniques;Passive range of motion;Dry needling;Iontophoresis 4mg /ml Dexamethasone   PT Next Visit Plan  pain control and neromuscular re education, manual techniques for spasms, ROM, therapeutic exercises, iontophoresis   PT Home Exercise Plan posture correction, scapular retraction with use of resistive bands, use of heat/ice; pulleys for ROM; stabilization supine lying, side lying ER, standing scaption, abduction and ER at wall   Consulted and Agree with Plan of Care Patient      Patient will benefit from skilled therapeutic intervention in order to improve the following deficits and impairments:  Decreased strength, Impaired flexibility, Pain, Impaired perceived functional ability, Decreased activity tolerance, Increased muscle spasms, Impaired UE functional use, Decreased range of motion  Visit Diagnosis: Muscle weakness (generalized)  Acute pain of right shoulder  Other muscle spasm     Problem List There are no active problems to display for this patient.   Beacher May PT 09/10/2016, 1:36 PM  [place] [facility] PHYSICAL AND SPORTS MEDICINE [address], [place], [address] Phone: [phone]   Fax:  [phone]  Name: Rachel Hinton MRN: [id number] Date of Birth: April 08, 1966

## 2016-09-13 ENCOUNTER — Encounter: Payer: Self-pay | Admitting: Physical Therapy

## 2016-09-13 ENCOUNTER — Ambulatory Visit: Payer: PRIVATE HEALTH INSURANCE | Admitting: Physical Therapy

## 2016-09-13 DIAGNOSIS — M25511 Pain in right shoulder: Secondary | ICD-10-CM

## 2016-09-13 DIAGNOSIS — M62838 Other muscle spasm: Secondary | ICD-10-CM

## 2016-09-13 DIAGNOSIS — M6281 Muscle weakness (generalized): Secondary | ICD-10-CM

## 2016-09-13 NOTE — Therapy (Signed)
[place] [facility] [address] [place], [address] Phone: [phone]   Fax:  [phone]  Physical Therapy Treatment  Patient Details  Name: Rachel Hinton MRN: [id number] Date of Birth: [birth year] Referring Provider: Jones Broom MD  Encounter Date: 09/13/2016      PT End of Session - 09/13/16 1210    Visit Number 14   Number of Visits 20   Date for PT Re-Evaluation 10/04/16   Authorization Type 14   Authorization Time Period 12 WC +   PT Start Time 1125   PT Stop Time 1215   PT Time Calculation (min) 50 min   Activity Tolerance Patient tolerated treatment well;Patient limited by pain   Behavior During Therapy [facility] for tasks assessed/performed      Past Medical History:  Diagnosis Date  . 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 CESAREAN SECTION    . SHOULDER ARTHROSCOPY WITH DISTAL CLAVICLE RESECTION Right 07/19/2016   Procedure: SHOULDER ARTHROSCOPY, DEBRIDEMENT PARTIAL ROTATOR CUFF TEAR, SUBACROMIAL DECOMPRESSION, DISTAL CLAVICAL EXCISION;  Surgeon: Jones Broom, MD;  Location: [facility];  Service: Orthopedics;  Laterality: Right;  . SUBACROMIAL DECOMPRESSION Right 07/19/2016   Procedure: SUBACROMIAL DECOMPRESSION;  Surgeon: Jones Broom, MD;  Location: [facility];  Service: Orthopedics;  Laterality: Right;    There were no vitals filed for this visit.      Subjective Assessment - 09/13/16 1130    Subjective Patient reports she is doing well hwever she feels shoulder "locks" when she goes to get up once in a while after sitting or being in one position too long. Her swelling is much better as well.    Limitations Lifting;House hold activities  reaching out to side, work related tasks   Patient Stated Goals To be able to use right arm without pain and back to normal activity   Currently in  Pain? Yes   Pain Score 2    Pain Location Shoulder   Pain Orientation Right   Pain Descriptors / Indicators Aching   Pain Type Acute pain;Surgical pain  07/19/2016   Pain Onset More than a month ago   Pain Frequency Intermittent        Objective; AROM right shoulder pre treatment: 0-155 flexion sitting Palpation; point tender AC joint right shoulder, anterior aspect/pectoral and subscapularis spasms.   Treatment: Manual therapy: STM performed with superficial technique and gentle stretching along anterior aspect right shoulder/pectroal muscles with patient supine lying in conjunction with exercises: goal: pain, spasms  Therapeutic exercise:performed with VC, tactile cues and demonstration of therapist Supine lying: AAROM with assistance of therapist forward elevation up to 150degrees with mild discomfortpain and spasmslimiting further motion, multiple reps/sets Rhythmic stabilization supine with right UE at 90 degrees 2 sets 5-10 reps mild/moderateresistance given Rhythmic stabilization for rotations with shoulder in plane of scapula: 2 x 10 reps Bilateral forward flexion overhead with 5# weight  X 15 reps for AAROM, strengthening Prone lying: Single arm row x 15 reps Horizontal abduction through partial ROM with controlled lowering x 10 reps  Modalities:  Electrical stimulation:high volt estim. Muscle spasm clinical protocol: applied (2) electrodes to upper trapezius and posterior aspect of right shoulder, russian stim. applied to right scapula/lower trapezius muscles 10/10 cycle with intensity to tolerance with ice pack applied to right shoulder with patient seated with right UE supported on pillow x .; no adverse reactions  noted: goal muscle re education, muscle spasm reduction, pain control Iontophoresiswith dexamethasone 4mg /ml @ 40 ma*min applied with largeelectrode to anterior aspect of right shoulder in conjunction with estim. (10min to apply); no adverse  reactions noted  Patient response to treatment: Improved motor control with repetition and improved technique with exercises with minimal cuing. Decreased swelling and tenderness by 30% following treatment         PT Education - 09/13/16 1134    Education provided Yes   Education Details HEP: continue with exercises as instructed for ROM, strengthening   Person(s) Educated Patient   Methods Explanation;Demonstration;Verbal cues   Comprehension Verbal cues required;Returned demonstration;Verbalized understanding             PT Long Term Goals - 09/06/16 1024      PT LONG TERM GOAL #1   Title Patient will demonstrate improved function with right UE for daily tasks as indicated by QuickDash score of 20% or less by 10/04/2016   Baseline initially 77%; crrent 09/06/16 40%   Status Revised     PT LONG TERM GOAL #2   Title Patient will demonstrate full pain free AROM for overhead and behind back with right UE use for personal care and work related activities by 10/04/2016   Baseline initially: unable to raise right UE without pain limiting motion, passive ROM <90 flexion; current 09/06/16 flexion cautious movement 165, behind back to hip pocket   Status Revised     PT LONG TERM GOAL #3   Title Patient will be independent with home program for pain control, exercise for right UE to allow self management once discharged from physical therapy by 10/04/2016   Baseline limited knowledge of appropriate exercises, pain control strategies and progression, requires guidance and assistance   Status Revised               Plan - 09/13/16 1220    Clinical Impression Statement Patient is progressing steadily with AROM, strength and functional use right UE. She continues with swelling anterior aspect of right shoulder, inferior to clavicle and decresaed strength in right UE and will benefit from continued physical therapy intervention to address limitations.    Rehab Potential Good   PT  Frequency 2x / week   PT Duration 4 weeks   PT Treatment/Interventions Electrical Stimulation;Cryotherapy;Moist Heat;Ultrasound;Patient/family education;Neuromuscular re-education;Therapeutic exercise;Manual techniques;Passive range of motion;Dry needling;Iontophoresis 4mg /ml Dexamethasone   PT Next Visit Plan pain control and neromuscular re education, manual techniques for spasms, ROM, therapeutic exercises, iontophoresis   PT Home Exercise Plan posture correction, scapular retraction with use of resistive bands, use of heat/ice; pulleys for ROM; stabilization supine lying, side lying ER, standing scaption, abduction and ER at wall   Consulted and Agree with Plan of Care Patient      Patient will benefit from skilled therapeutic intervention in order to improve the following deficits and impairments:  Decreased strength, Impaired flexibility, Pain, Impaired perceived functional ability, Decreased activity tolerance, Increased muscle spasms, Impaired UE functional use, Decreased range of motion  Visit Diagnosis: Muscle weakness (generalized)  Acute pain of right shoulder  Other muscle spasm     Problem List There are no active problems to display for this patient.   Beacher MayBrooks, Ceili Boshers PT 09/13/2016, 10:15 PM  [place] [facility] [address] [place], [place], [address] Phone: [phone]   Fax:  [phone]  Name: Sherrine Mapleslejandra Milles MRN: [id number] Date of Birth: [birth year]

## 2016-09-16 ENCOUNTER — Encounter: Payer: Self-pay | Admitting: Physical Therapy

## 2016-09-16 ENCOUNTER — Ambulatory Visit: Payer: PRIVATE HEALTH INSURANCE | Admitting: Physical Therapy

## 2016-09-16 DIAGNOSIS — M25511 Pain in right shoulder: Secondary | ICD-10-CM

## 2016-09-16 DIAGNOSIS — M62838 Other muscle spasm: Secondary | ICD-10-CM

## 2016-09-16 DIAGNOSIS — M6281 Muscle weakness (generalized): Secondary | ICD-10-CM

## 2016-09-16 NOTE — Therapy (Signed)
[facility] PHYSICAL AND SPORTS MEDICINE [address] [place], [place], [address] Phone: [phone]   Fax:  [phone]  Physical Therapy Treatment  Patient Details  Name: Rachel Mapleslejandra Thatch MRN: [id number] Date of Birth: 01-25-1966 Referring Provider: Jones Broomhandler, Justin MD  Encounter Date: 09/16/2016      PT End of Session - 09/16/16 1122    Visit Number 16   Number of Visits 20   Date for PT Re-Evaluation 10/04/16   Authorization Type 16   Authorization Time Period 12 WC +   PT Start Time 1115   PT Stop Time 1207   PT Time Calculation (min) 52 min   Activity Tolerance Patient tolerated treatment well;Patient limited by pain   Behavior During Therapy [facility] for tasks assessed/performed      Past Medical History:  Diagnosis Date  . 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 CESAREAN SECTION    . SHOULDER ARTHROSCOPY WITH DISTAL CLAVICLE RESECTION Right 07/19/2016   Procedure: SHOULDER ARTHROSCOPY, DEBRIDEMENT PARTIAL ROTATOR CUFF TEAR, SUBACROMIAL DECOMPRESSION, DISTAL CLAVICAL EXCISION;  Surgeon: Jones BroomJustin Chandler, MD;  Location: [facility];  Service: Orthopedics;  Laterality: Right;  . SUBACROMIAL DECOMPRESSION Right 07/19/2016   Procedure: SUBACROMIAL DECOMPRESSION;  Surgeon: Jones BroomJustin Chandler, MD;  Location: [facility];  Service: Orthopedics;  Laterality: Right;    There were no vitals filed for this visit.      Subjective Assessment - 09/16/16 1118    Subjective Patient reports she worked by herself today and went more quickly and is sore on arrival to PT pain level 3/10. She reports she is feeling improvement with each session and notices a good difference with mobilizaitons and feels she will need more therapy to get stronger and be able to return to prior level of function.     Limitations Lifting;House hold activities  reaching out to side,  work related tasks   Patient Stated Goals To be able to use right arm without pain and back to normal activity   Currently in Pain? Yes   Pain Score 3    Pain Location Shoulder   Pain Orientation Right   Pain Descriptors / Indicators Aching   Pain Type Surgical pain;Acute pain  07/19/2016   Pain Onset More than a month ago   Pain Frequency Intermittent     Objective:  Palpation: moderate spasm right upper trapezius muscles, subscapularis, tender anterior aspect of right shoulder Observation; mild swelling right shoulder superior, inferior to clavicle  AROM right shoulder pre treatment: 0-155 flexion sitting   Treatment: Manual therapy:  STM performed with superficial technique and gentle stretching along anterior aspect right shoulder/pectoral muscles with patient supine lying in conjunction with exercises; GHJ mobilization lateral distraction, inferior glides grade 1-3, 10 reps, MFR arm pull : goal; decreased spasms, pain, improve ROM  Therapeutic exercise; performed with verbal, tactile cuing and demonstration of therapist Supine lying: AAROM right shoulder forward flexion, rotations through full ROM sets of 5 reps in conjunction with other exercises/manual therapy techniques Rhythmic stabilization right shoulder flexion/extension and rotations in scapular plane x 10 reps Prone lying: Singe arm row with 3# weight x 10 reps Shoulder extension without weight x 10 reps Standing at wall; Bilateral shoulder scaption x 15 reps Bilateral shoulder ER x 15 reps with shoulders in alignment and scapulae stabilized  Modalities: Electrical stimulation: Russian stim. 10/10 cycle applied (2) electrodes to mid thoracic spine medial  border of scapula right, lower trapezius, intensity to contraction/tolerance, goal muscle re education; High volt estim.clincial program for muscle spasms  (2) electrodes applied to right shoulder upper trapezius  And posterior aspect of shoulder with patient seated  with right UE supported on pillow  x 20 min.; goal: pain, spasms; ice pack applied to same during treatment (no adverse reaction noted) Iontophoresis with dexamethasone 4mg /ml @ 52ma*min applied with large electrode to anterior aspect of right shoulder in conjunction with estim. (8 min. to apply); goal pain (no adverse reaction noted)   Patient response to treatment: Improved motor control with repetition and improved technique with exercises with minimal cuing. Decreased swelling and spasms by >30% with treatment.          PT Education - 09/16/16 1121    Education provided Yes   Education Details HEP; continue with progressive strengthening   Person(s) Educated Patient   Methods Explanation;Demonstration;Verbal cues   Comprehension Verbalized understanding;Returned demonstration;Verbal cues required             PT Long Term Goals - 09/06/16 1024      PT LONG TERM GOAL #1   Title Patient will demonstrate improved function with right UE for daily tasks as indicated by QuickDash score of 20% or less by 10/04/2016   Baseline initially 77%; crrent 09/06/16 40%   Status Revised     PT LONG TERM GOAL #2   Title Patient will demonstrate full pain free AROM for overhead and behind back with right UE use for personal care and work related activities by 10/04/2016   Baseline initially: unable to raise right UE without pain limiting motion, passive ROM <90 flexion; current 09/06/16 flexion cautious movement 165, behind back to hip pocket   Status Revised     PT LONG TERM GOAL #3   Title Patient will be independent with home program for pain control, exercise for right UE to allow self management once discharged from physical therapy by 10/04/2016   Baseline limited knowledge of appropriate exercises, pain control strategies and progression, requires guidance and assistance   Status Revised               Plan - 09/16/16 1122    Clinical Impression Statement Patient is progressing  steadily with ROM, strength and endurance for functional use right UE. She continues with swelling anterior aspect of right shoulder, inferior to clavicle and decresaed strength in right UE and will benefit from continued physical therapy intervention to address limitations and achieve goals.    Rehab Potential Good   PT Frequency 2x / week   PT Duration 4 weeks   PT Treatment/Interventions Electrical Stimulation;Cryotherapy;Moist Heat;Ultrasound;Patient/family education;Neuromuscular re-education;Therapeutic exercise;Manual techniques;Passive range of motion;Dry needling;Iontophoresis 4mg /ml Dexamethasone   PT Next Visit Plan pain control and neromuscular re education, manual techniques for spasms, ROM, therapeutic exercises, iontophoresis   PT Home Exercise Plan posture correction, scapular retraction with use of resistive bands, use of heat/ice; pulleys for ROM; stabilization supine lying, side lying ER, standing scaption, abduction and ER at wall   Consulted and Agree with Plan of Care Patient      Patient will benefit from skilled therapeutic intervention in order to improve the following deficits and impairments:  Decreased strength, Impaired flexibility, Pain, Impaired perceived functional ability, Decreased activity tolerance, Increased muscle spasms, Impaired UE functional use, Decreased range of motion  Visit Diagnosis: Muscle weakness (generalized)  Acute pain of right shoulder  Other muscle spasm     Problem List There are  no active problems to display for this patient.   Beacher May PT 09/17/2016, 3:45 PM  [place] [facility] PHYSICAL AND SPORTS MEDICINE [address], [place], [address] Phone: [phone]   Fax:  [phone]  Name: Lizabeth Fellner MRN: [id number] Date of Birth: July 28, 1965

## 2016-09-20 ENCOUNTER — Ambulatory Visit: Payer: PRIVATE HEALTH INSURANCE | Admitting: Physical Therapy

## 2016-09-20 ENCOUNTER — Encounter: Payer: Self-pay | Admitting: Physical Therapy

## 2016-09-20 DIAGNOSIS — M25511 Pain in right shoulder: Secondary | ICD-10-CM

## 2016-09-20 DIAGNOSIS — M6281 Muscle weakness (generalized): Secondary | ICD-10-CM

## 2016-09-20 DIAGNOSIS — M62838 Other muscle spasm: Secondary | ICD-10-CM

## 2016-09-20 NOTE — Therapy (Signed)
[place] [facility]ORTS MEDICINE [address], [place], [address] Phone: [phone]   Fax:  [phone]  Physical Therapy Treatment  Patient Details  Name: Rachel Hinton MRN: [id number] Date of Birth: 1965/12/21 Referring Provider: Jones Broom MD  Encounter Date: 09/20/2016      PT End of Session - 09/20/16 1137    Visit Number 17   Number of Visits 20   Date for PT Re-Evaluation 10/04/16   Authorization Type 17   Authorization Time Period 24 WC   PT Start Time 1128   PT Stop Time 1233   PT Time Calculation (min) 65 min   Activity Tolerance Patient tolerated treatment well;Patient limited by pain   Behavior During Therapy [facility] for tasks assessed/performed      Past Medical History:  Diagnosis Date  . 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 CESAREAN SECTION    . SHOULDER ARTHROSCOPY WITH DISTAL CLAVICLE RESECTION Right 07/19/2016   Procedure: SHOULDER ARTHROSCOPY, DEBRIDEMENT PARTIAL ROTATOR CUFF TEAR, SUBACROMIAL DECOMPRESSION, DISTAL CLAVICAL EXCISION;  Surgeon: Jones Broom, MD;  Location: [facility];  Service: Orthopedics;  Laterality: Right;  . SUBACROMIAL DECOMPRESSION Right 07/19/2016   Procedure: SUBACROMIAL DECOMPRESSION;  Surgeon: Jones Broom, MD;  Location: [facility];  Service: Orthopedics;  Laterality: Right;    There were no vitals filed for this visit.      Subjective Assessment - 09/20/16 1129    Subjective Patient reports she was seen by Dr. Ave Filter this morning and is to continue with PT 4-6 more weeks    Limitations Lifting;House hold activities  reaching out to side, work related tasks   Patient Stated Goals To be able to use right arm without pain and back to normal activity   Currently in Pain? Yes   Pain Score 2    Pain Location Shoulder   Pain Orientation Right   Pain Descriptors  / Indicators Aching;Burning   Pain Type Surgical pain;Acute pain  07/19/2016   Pain Radiating Towards --  into right upper arm   Pain Onset More than a month ago   Pain Frequency Intermittent        Objective:  Palpation: + spasms palpable right shoulder subscapularis, lats.  Observation; mild swelling right shoulder superior, inferior to clavicle  AROM right shoulder pre and post treatment: 0-167flexion sitting AAROM right shoulder flexion, rotations supine through full ROM   Treatment: Manual therapy:  STM performed with superficial technique to subscapularis, patient supine lying in conjunction with exercises; GHJ mobilization lateral distraction, inferior glides grade 1-3, 10 reps, MFR arm pull : goal; decreased spasms, pain, improve ROM  Therapeutic exercise; performed with verbal, tactile cuing and demonstration of therapist Supine lying: AAROM right shoulder forward flexion, rotations through full ROM sets of 5 reps in conjunction with other exercises/manual therapy techniques Serratus punch right UE x 10 reps with guided motion Rhythmic stabilization right shoulder flexion/extension and rotations in scapular plane x 10 reps Prone lying: right UE:  Singe arm row right UE with 5# weight x 5 reps, 4# dumbbell x 10 reps Shoulder extension with 2#  weight x 10 reps Standing at wall; Wall push ups through partial ROM with demonstration and VC x 10 reps Bilateral shoulder ER with red resistive band x 15 reps with shoulders in alignment and scapulae stabilized  Modalities: Electrical stimulation: Russian stim. 10/10 cycle applied (2) electrodes to  mid thoracic spine medial border of scapula right, lower trapezius, intensity to contraction/tolerance, goal muscle re education; High volt estim.clincial program for muscle spasms  (2) electrodes applied to right shoulder AC joint and posterior aspect of shoulder with patient seated with right UE supported on pillow  x 20 min.; goal:  pain, spasms; ice pack applied to same during treatment (no adverse reaction noted) Iontophoresis with dexamethasone 4mg /ml @ 1740ma*min applied with large electrode to anterior aspect of right shoulder in conjunction with estim. (8 min. to apply); goal pain, swelling (no adverse reaction noted)   Patient response to treatment: patient demonstrated improved motor control and technique with repetition and minimal VC for correct alignment of right UE/shoulder. She demonstrated decreased spasm in subscapularis and able to perform exercises with increased resistance for scapular row and extension. Decreased swelling and spasms by >30% with treatment. No adverse reactions noted following iontophoresis or ice today. atient reports no burning in right upper arm at end of session following modalities.  .         PT Education - 09/20/16 1136    Education provided Yes   Education Details HEP; progress strengthening as tolerated, avoid increased pain; added serratus punch supine and wall push ups through partial ROM   Person(s) Educated Patient   Methods Explanation;Demonstration;Verbal cues   Comprehension Verbalized understanding;Returned demonstration;Verbal cues required             PT Long Term Goals - 09/06/16 1024      PT LONG TERM GOAL #1   Title Patient will demonstrate improved function with right UE for daily tasks as indicated by QuickDash score of 20% or less by 10/04/2016   Baseline initially 77%; crrent 09/06/16 40%   Status Revised     PT LONG TERM GOAL #2   Title Patient will demonstrate full pain free AROM for overhead and behind back with right UE use for personal care and work related activities by 10/04/2016   Baseline initially: unable to raise right UE without pain limiting motion, passive ROM <90 flexion; current 09/06/16 flexion cautious movement 165, behind back to hip pocket   Status Revised     PT LONG TERM GOAL #3   Title Patient will be independent with home program  for pain control, exercise for right UE to allow self management once discharged from physical therapy by 10/04/2016   Baseline limited knowledge of appropriate exercises, pain control strategies and progression, requires guidance and assistance   Status Revised               Plan - 09/20/16 1215    Clinical Impression Statement Patient demonstrates steady progress towards goals with improving pain, functional use left UE and strength. She continues with pain with increased activity and cannot lift >5# without increased difficulty. She will benefit from continued physical therpay intervention to progress exercises appropriately and for pain control as she heals from surgery.    Rehab Potential Good   PT Frequency 2x / week   PT Duration 4 weeks   PT Treatment/Interventions Electrical Stimulation;Cryotherapy;Moist Heat;Ultrasound;Patient/family education;Neuromuscular re-education;Therapeutic exercise;Manual techniques;Passive range of motion;Dry needling;Iontophoresis 4mg /ml Dexamethasone   PT Next Visit Plan pain control and neromuscular re education, manual techniques for spasms, ROM, therapeutic exercises, iontophoresis   PT Home Exercise Plan posture correction, scapular retraction with use of resistive bands, use of heat/ice; pulleys for ROM; stabilization supine lying, side lying ER, standing scaption, abduction and ER at wall   Consulted and Agree with Plan of Care  Patient      Patient will benefit from skilled therapeutic intervention in order to improve the following deficits and impairments:  Decreased strength, Impaired flexibility, Pain, Impaired perceived functional ability, Decreased activity tolerance, Increased muscle spasms, Impaired UE functional use, Decreased range of motion  Visit Diagnosis: Muscle weakness (generalized)  Acute pain of right shoulder  Other muscle spasm     Problem List There are no active problems to display for this patient.   Beacher May PT 09/20/2016, 3:51 PM  [place] [facility] PHYSICAL AND SPORTS MEDICINE [address] [place], [address] Phone: [phone]   Fax:  [phone]  Name: Teran Daughenbaugh MRN: [id number] Date of Birth: December 04, 1965

## 2016-09-23 ENCOUNTER — Encounter: Payer: Self-pay | Admitting: Physical Therapy

## 2016-09-23 ENCOUNTER — Ambulatory Visit: Payer: PRIVATE HEALTH INSURANCE | Admitting: Physical Therapy

## 2016-09-23 DIAGNOSIS — M62838 Other muscle spasm: Secondary | ICD-10-CM

## 2016-09-23 DIAGNOSIS — M25511 Pain in right shoulder: Secondary | ICD-10-CM

## 2016-09-23 DIAGNOSIS — M6281 Muscle weakness (generalized): Secondary | ICD-10-CM

## 2016-09-24 NOTE — Therapy (Signed)
[place] [facility] PH[facility] SPORTS MEDICINE [address] [place], [address] Phone: [phone]   Fax:  [phone]  Physical Therapy Treatment  Patient Details  Name: Natalina Wieting MRN: [id number] Date of Birth: 27-Aug-1965 Referring Provider: Jones Broom MD  Encounter Date: 09/23/2016      PT End of Session - 09/23/16 1217    Visit Number 18   Number of Visits 20   Date for PT Re-Evaluation 10/04/16   Authorization Type 18   Authorization Time Period 24 WC   PT Start Time 1125   PT Stop Time 1215   PT Time Calculation (min) 50 min   Activity Tolerance Patient tolerated treatment well;Patient limited by pain   Behavior During Therapy [facility] for tasks assessed/performed      Past Medical History:  Diagnosis Date  . 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 CESAREAN SECTION    . SHOULDER ARTHROSCOPY WITH DISTAL CLAVICLE RESECTION Right 07/19/2016   Procedure: SHOULDER ARTHROSCOPY, DEBRIDEMENT PARTIAL ROTATOR CUFF TEAR, SUBACROMIAL DECOMPRESSION, DISTAL CLAVICAL EXCISION;  Surgeon: Jones Broom, MD;  Location: [facility];  Service: Orthopedics;  Laterality: Right;  . SUBACROMIAL DECOMPRESSION Right 07/19/2016   Procedure: SUBACROMIAL DECOMPRESSION;  Surgeon: Jones Broom, MD;  Location: [facility];  Service: Orthopedics;  Laterality: Right;    There were no vitals filed for this visit.      Subjective Assessment - 09/23/16 1128    Subjective Patient reports she is sore under her right arm from massaging spasms over the past few days. She is now able to raise raise arm overhead with less difficulty and stiffness.    Limitations Lifting;House hold activities  reaching out to side, work related tasks   Patient Stated Goals To be able to use right arm without pain and back to normal activity   Currently in Pain? Yes   Pain Score  2    Pain Location Shoulder   Pain Orientation Right   Pain Descriptors / Indicators Sore   Pain Type Surgical pain  07/19/2016   Pain Onset More than a month ago   Pain Frequency Intermittent      Objective:  Palpation: + spasms palpable right shoulder subscapularis, lats.  Observation; mild swelling right shoulder superior, inferior to clavicle   Treatment: Manual therapy:  STM performed with superficial technique to subscapularis, patient supine lying in conjunction with exercises; GHJ mobilization lateral distraction, inferior glides grade 1-3, 10 reps, MFR arm pull : goal; decreased spasms, pain, improve ROM  Therapeutic exercise; performed with verbal, tactile cuing and demonstration of therapist Supine lying: AAROM right shoulder forward flexion, rotations through full ROM sets of 5 reps in conjunction with other exercises/manual therapy techniques Serratus punch right UE x 10 reps with guided motion Rhythmic stabilization right shoulder flexion/extension and rotations in scapular plane x 10 reps Standing at wall; Wall push ups through partial ROM with demonstration and VC x 10 reps Bilateral shoulder ER x 15 reps with shoulders in alignment and scapulae stabilized  Modalities: Electrical stimulation:Russian stim. 10/10 cycle applied (2) electrodes to mid thoracic spine medial border of scapula right, lower trapezius, intensity to contraction/tolerance,goal muscle re education; High volt estim.clincial program for muscle spasms (2) electrodes applied to right shoulder upper trapezius and posterior aspect of shoulder with patient seated with right UE supported on pillow x 20 min.; goal: pain, spasms; ice pack applied to same  during treatment (no adverse reaction noted) Iontophoresis with dexamethasone /ml @ 88ma*min applied with large electrode to anterior aspect of right shoulder in conjunction with estim. (8 min. to apply); goal pain, swelling (no adverse reaction  noted)   Patient response to treatment: Patient demonstrated improved spasms by 50% following STM. She required minimal cuing and assistance to perform exercises with good technique and shoulder alignment. No adverse reactions noted following iontophoresis and ice treatment.        PT Education - 09/23/16 1145    Education provided Yes   Education Details HEP: reassessed home exercises   Person(s) Educated Patient   Methods Explanation   Comprehension Verbalized understanding             PT Long Term Goals - 09/06/16 1024      PT LONG TERM GOAL #1   Title Patient will demonstrate improved function with right UE for daily tasks as indicated by QuickDash score of 20% or less by 10/04/2016   Baseline initially 77%; crrent 09/06/16 40%   Status Revised     PT LONG TERM GOAL #2   Title Patient will demonstrate full pain free AROM for overhead and behind back with right UE use for personal care and work related activities by 10/04/2016   Baseline initially: unable to raise right UE without pain limiting motion, passive ROM <90 flexion; current 09/06/16 flexion cautious movement 165, behind back to hip pocket   Status Revised     PT LONG TERM GOAL #3   Title Patient will be independent with home program for pain control, exercise for right UE to allow self management once discharged from physical therapy by 10/04/2016   Baseline limited knowledge of appropriate exercises, pain control strategies and progression, requires guidance and assistance   Status Revised               Plan - 09/23/16 1217    Clinical Impression Statement Patient demonstrates continued improvement with ROM, strength rigth UE and improving functional use right UE. She continues with spasms, limited AROM and strength  She will benefit from continued physical therapy intervention to address limitations and achieve goals.    Rehab Potential Good   PT Frequency 2x / week   PT Duration 4 weeks   PT  Treatment/Interventions Electrical Stimulation;Cryotherapy;Moist Heat;Ultrasound;Patient/family education;Neuromuscular re-education;Therapeutic exercise;Manual techniques;Passive range of motion;Dry needling;Iontophoresis /ml Dexamethasone   PT Next Visit Plan pain control and neromuscular re education, manual techniques for spasms, ROM, therapeutic exercises, iontophoresis   PT Home Exercise Plan posture correction, scapular retraction with use of resistive bands, use of heat/ice; pulleys for ROM; stabilization supine lying, side lying ER, standing scaption, abduction and ER at wall      Patient will benefit from skilled therapeutic intervention in order to improve the following deficits and impairments:  Decreased strength, Impaired flexibility, Pain, Impaired perceived functional ability, Decreased activity tolerance, Increased muscle spasms, Impaired UE functional use, Decreased range of motion  Visit Diagnosis: Muscle weakness (generalized)  Acute pain of right shoulder  Other muscle spasm     Problem List There are no active problems to display for this patient.   Beacher May PT 09/24/2016, 7:12 PM  [place] [facility] PHYSICAL AND SPORTS MEDICINE [address] [place], [address] Phone: [phone]   Fax:  [phone]  Name: Arlana Canizales MRN: [id number] Date of Birth: 1966/06/25

## 2016-09-27 ENCOUNTER — Encounter: Payer: Self-pay | Admitting: Physical Therapy

## 2016-09-27 ENCOUNTER — Ambulatory Visit: Payer: PRIVATE HEALTH INSURANCE | Attending: Orthopedic Surgery | Admitting: Physical Therapy

## 2016-09-27 DIAGNOSIS — M25511 Pain in right shoulder: Secondary | ICD-10-CM | POA: Insufficient documentation

## 2016-09-27 DIAGNOSIS — M62838 Other muscle spasm: Secondary | ICD-10-CM | POA: Diagnosis present

## 2016-09-27 DIAGNOSIS — M6281 Muscle weakness (generalized): Secondary | ICD-10-CM | POA: Diagnosis present

## 2016-09-28 NOTE — Therapy (Signed)
[place] [facility] [address] [place], [address] Phone: [phone]   Fax:  [phone]  Physical Therapy Treatment  Patient Details  Name: Rachel Hinton MRN: [id number] Date of Birth: [birth year] Referring Provider: Jones Broom MD  Encounter Date: 09/27/2016      PT End of Session - 09/27/16 1041    Visit Number 19   Number of Visits 20   Date for PT Re-Evaluation 10/04/16   Authorization Type 19   Authorization Time Period 24 WC   PT Start Time 1035   PT Stop Time 1123   PT Time Calculation (min) 48 min   Activity Tolerance Patient tolerated treatment well;Patient limited by pain   Behavior During Therapy [facility] for tasks assessed/performed      Past Medical History:  Diagnosis Date  . 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 CESAREAN SECTION    . SHOULDER ARTHROSCOPY WITH DISTAL CLAVICLE RESECTION Right 07/19/2016   Procedure: SHOULDER ARTHROSCOPY, DEBRIDEMENT PARTIAL ROTATOR CUFF TEAR, SUBACROMIAL DECOMPRESSION, DISTAL CLAVICAL EXCISION;  Surgeon: Jones Broom, MD;  Location: [facility];  Service: Orthopedics;  Laterality: Right;  . SUBACROMIAL DECOMPRESSION Right 07/19/2016   Procedure: SUBACROMIAL DECOMPRESSION;  Surgeon: Jones Broom, MD;  Location: [facility];  Service: Orthopedics;  Laterality: Right;    There were no vitals filed for this visit.      Subjective Assessment - 09/27/16 1038    Subjective Patient reports she is progressing with using right arm with less difficulty with hair care and dressing. She still feels 50% impaired from full function with right arm for work and household chores. She is having increased  Back soreness today.    Limitations Lifting;House hold activities  reaching out to side, work related tasks   Patient Stated Goals To be able to use right arm without  pain and back to normal activity   Currently in Pain? Yes   Pain Score 2    Pain Location Shoulder   Pain Orientation Right   Pain Descriptors / Indicators Sore   Pain Type Surgical pain  07/19/2016   Pain Onset More than a month ago   Pain Frequency Intermittent        Objective:  Palpation: + spasms right shoulder subscapularis, lats., mild/moderate  Observation; mild swelling right shoulder superior, inferior to clavicle   Treatment: Manual therapy:  STM performed with superficial technique to subscapularis,patient supine lying in conjunction with exercises; GHJ mobilization lateral distraction, inferior glides grade 1-3, 10 reps, MFR arm pull : goal; decreased spasms, pain, improve ROM  Therapeutic exercise; performed with verbal, tactile cuing and demonstration of therapist: goals: strength, independent with home program; ROM Supine lying: AAROM right shoulder forward flexion, rotations through full ROM sets of 5 reps in conjunction with other exercises/manual therapy techniques Serratus punch right UE x 10 reps with guided motion with reported increased shoulder/ joint discomfort Rhythmic stabilization right shoulder flexion/extension and rotations in scapular plane x 10 reps Standing at wall; Bilateral shoulder ERx 15 reps with shoulders in alignment and scapulae stabilized OMEGA cable exercises: right UE focus Single arm row 5# each standing x 10 reps (increased effort with right UE as compared to left) Seated reverse chin up 20# x 15 reps Straight arm pull down 15# x 15 reps    Modalities: Electrical stimulation:Russian stim. 10/10 cycle applied (2) electrodes to mid thoracic spine medial  border of scapula right, lower trapezius, intensity to contraction/tolerance,goal muscle re education; High volt estim.clincial program for muscle spasms (2) electrodes applied to right shoulder upper trapezius and posterior aspect of shoulder with patient seated with right UE  supported on pillow x 20 min.; goal: pain, spasms; ice pack applied to same during treatment (no adverse reaction noted) Iontophoresis with dexamethasone /ml @ 36ma*min applied with large electrode to anterior aspect of right shoulder in conjunction with estim. (8 min. to apply); goal pain, swelling(no adverse reaction noted)   Patient response to treatment: Patient with decreased soreness in shoulder and improved ROM with less difficulty for forward elevation and IR behind back. No adverse reactions to modalities noted. improved motor control with exercises for right UE with repetition. No increased pain overall with treatment.          PT Education - 09/27/16 1041    Education provided Yes   Education Details HEP: re assessed stretches to improve ROM for IR behind back and overhead elevation   Person(s) Educated Patient   Methods Explanation;Demonstration;Verbal cues   Comprehension Verbalized understanding;Returned demonstration;Verbal cues required             PT Long Term Goals - 09/06/16 1024      PT LONG TERM GOAL #1   Title Patient will demonstrate improved function with right UE for daily tasks as indicated by QuickDash score of 20% or less by 10/04/2016   Baseline initially 77%; crrent 09/06/16 40%   Status Revised     PT LONG TERM GOAL #2   Title Patient will demonstrate full pain free AROM for overhead and behind back with right UE use for personal care and work related activities by 10/04/2016   Baseline initially: unable to raise right UE without pain limiting motion, passive ROM <90 flexion; current 09/06/16 flexion cautious movement 165, behind back to hip pocket   Status Revised     PT LONG TERM GOAL #3   Title Patient will be independent with home program for pain control, exercise for right UE to allow self management once discharged from physical therapy by 10/04/2016   Baseline limited knowledge of appropriate exercises, pain control strategies and  progression, requires guidance and assistance   Status Revised               Plan - 09/27/16 1042    Clinical Impression Statement Patient with improving ROM into IR behind back following joint mobilization and stretching. She requires less cuing to complete exercises indicating good carry over and progression with strength and motor control. She continues with decreased strength, functional use right UE due to pain, weakness and will benefit from additional physical therapy intervention to achieve goals.     Rehab Potential Good   PT Frequency 2x / week   PT Duration 4 weeks   PT Treatment/Interventions Electrical Stimulation;Cryotherapy;Moist Heat;Ultrasound;Patient/family education;Neuromuscular re-education;Therapeutic exercise;Manual techniques;Passive range of motion;Dry needling;Iontophoresis /ml Dexamethasone   PT Next Visit Plan pain control and neromuscular re education, manual techniques for spasms, ROM, therapeutic exercises, iontophoresis   PT Home Exercise Plan posture correction, scapular retraction with use of resistive bands, use of heat/ice; pulleys for ROM; stabilization supine lying, side lying ER, standing scaption, abduction and ER at wall      Patient will benefit from skilled therapeutic intervention in order to improve the following deficits and impairments:  Decreased strength, Impaired flexibility, Pain, Impaired perceived functional ability, Decreased activity tolerance, Increased muscle spasms, Impaired UE functional use, Decreased range of  motion  Visit Diagnosis: Muscle weakness (generalized)  Other muscle spasm  Acute pain of right shoulder     Problem List There are no active problems to display for this patient.   Beacher May PT 09/28/2016, 1:54 PM  [place] [facility] [address], [place], [address] Phone: [phone]   Fax:  [phone]  Name: Rachel Hinton MRN:  [id number] Date of Birth: 1966-01-28

## 2016-09-30 ENCOUNTER — Encounter: Payer: Self-pay | Admitting: Physical Therapy

## 2016-09-30 ENCOUNTER — Ambulatory Visit: Payer: PRIVATE HEALTH INSURANCE | Admitting: Physical Therapy

## 2016-09-30 DIAGNOSIS — M6281 Muscle weakness (generalized): Secondary | ICD-10-CM | POA: Diagnosis not present

## 2016-09-30 DIAGNOSIS — M62838 Other muscle spasm: Secondary | ICD-10-CM

## 2016-09-30 DIAGNOSIS — M25511 Pain in right shoulder: Secondary | ICD-10-CM

## 2016-10-01 NOTE — Therapy (Signed)
[place] [facility] [address] [place], [address] Phone: [phone]   Fax:  [phone]  Physical Therapy Treatment  Patient Details  Name: Rachel Hinton MRN: [id number] Date of Birth: 1965/11/30 Referring Provider: Jones Broom MD  Encounter Date: 09/30/2016      PT End of Session - 09/30/16 1030    Visit Number 20   Number of Visits 20   Date for PT Re-Evaluation 10/04/16   Authorization Type 20   Authorization Time Period 24 WC   PT Start Time [phone]   PT Stop Time 1025   PT Time Calculation (min) 47 min   Activity Tolerance Patient tolerated treatment well;Patient limited by pain   Behavior During Therapy [facility] for tasks assessed/performed      Past Medical History:  Diagnosis Date  . 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 CESAREAN SECTION    . SHOULDER ARTHROSCOPY WITH DISTAL CLAVICLE RESECTION Right 07/19/2016   Procedure: SHOULDER ARTHROSCOPY, DEBRIDEMENT PARTIAL ROTATOR CUFF TEAR, SUBACROMIAL DECOMPRESSION, DISTAL CLAVICAL EXCISION;  Surgeon: Jones Broom, MD;  Location: [facility];  Service: Orthopedics;  Laterality: Right;  . SUBACROMIAL DECOMPRESSION Right 07/19/2016   Procedure: SUBACROMIAL DECOMPRESSION;  Surgeon: Jones Broom, MD;  Location: [facility];  Service: Orthopedics;  Laterality: Right;    There were no vitals filed for this visit.      Subjective Assessment - 09/30/16 0941    Subjective Patient reports she is still having soreness and stiffness in her right shoulder with raising arm overhead. overall it is improving with therapy and exercise, estim. and iontophoresis treatments.    Limitations Lifting;House hold activities  reaching out to side, work related tasks   Patient Stated Goals To be able to use right arm without pain and back to normal activity   Currently in  Pain? No/denies      Objective: Palpation: decreased joint mobility right shoulder posterior glide, inferior glide, decreased thoracic spine PA mobility Observation; mild swelling right shoulder superior> inferior to clavicle   Treatment: Manual therapy: goal; decrease spasms, pain, improve ROM STM performed with superficial technique to subscapularis,patient supine lying in conjunction with ROM; GHJ mobilization lateral distraction, inferior glides grade 1-3, 10 reps, MFR arm pull,  Prone lying: thoracic spine mobilization grade 2-3 PA glides 3 bouts, 30 seconds each level, instructed in self mobilization over back of chair,  seated thoracic mobilization performed 3 sets grade 2 each level T4-10 by therapist   Modalities: Electrical stimulation:High volt estim.clincial program for muscle spasms (2) electrodes applied to right shoulder lower trapeziusand posterior aspect of shoulder with patient seated with right UE supported on pillow x 20 min.; goal: pain, spasms; moist heat pack applied to same during treatment (no adverse reaction noted) Iontophoresis with dexamethasone /ml @ 79ma*min applied with large electrode to anterior aspect of right shoulder in conjunction with estim. (8 min. to apply); goal pain, swelling(no adverse reaction noted)   Patient response to treatment: Following manual therapy techniques patient demonstrated improved flexibility in thoracic spine and able to raise right UE overhead with greater ease and decreased stiffness and pain reported.      PT Education - 09/30/16 0944    Education provided Yes   Education Details HEP; add thoracic spine extension, mobilization over back of chair   Person(s) Educated Patient   Methods Explanation   Comprehension Verbalized understanding  PT Long Term Goals - 09/06/16 1024      PT LONG TERM GOAL #1   Title Patient will demonstrate improved function with right UE for daily tasks as indicated  by QuickDash score of 20% or less by 10/04/2016   Baseline initially 77%; crrent 09/06/16 40%   Status Revised     PT LONG TERM GOAL #2   Title Patient will demonstrate full pain free AROM for overhead and behind back with right UE use for personal care and work related activities by 10/04/2016   Baseline initially: unable to raise right UE without pain limiting motion, passive ROM <90 flexion; current 09/06/16 flexion cautious movement 165, behind back to hip pocket   Status Revised     PT LONG TERM GOAL #3   Title Patient will be independent with home program for pain control, exercise for right UE to allow self management once discharged from physical therapy by 10/04/2016   Baseline limited knowledge of appropriate exercises, pain control strategies and progression, requires guidance and assistance   Status Revised               Plan - 09/30/16 1025    Clinical Impression Statement Patient continues with steady progress towards goals with improvement noted in IR behind back and able to raise her right UE overhead with greater ease, decreased stiffness and pain following treatment. She continues with pain, decreased strength that will benefit from additional physical therapy intervention to achieve goals.    Rehab Potential Good   PT Frequency 2x / week   PT Duration 4 weeks   PT Treatment/Interventions Electrical Stimulation;Cryotherapy;Moist Heat;Ultrasound;Patient/family education;Neuromuscular re-education;Therapeutic exercise;Manual techniques;Passive range of motion;Dry needling;Iontophoresis /ml Dexamethasone   PT Next Visit Plan pain control and neromuscular re education, manual techniques for spasms, ROM, therapeutic exercises, iontophoresis   PT Home Exercise Plan posture correction, scapular retraction with use of resistive bands, use of heat/ice; pulleys for ROM; stabilization supine lying, side lying ER, standing scaption, abduction and ER at wall      Patient will  benefit from skilled therapeutic intervention in order to improve the following deficits and impairments:  Decreased strength, Impaired flexibility, Pain, Impaired perceived functional ability, Decreased activity tolerance, Increased muscle spasms, Impaired UE functional use, Decreased range of motion  Visit Diagnosis: Muscle weakness (generalized)  Other muscle spasm  Acute pain of right shoulder     Problem List There are no active problems to display for this patient.   Beacher May PT 10/01/2016, 3:08 PM  [place] [facility] [address] [place], [address] Phone: [phone]   Fax:  [phone]  Name: Rachel Hinton MRN: [id number] Date of Birth: 1966-01-25

## 2016-10-04 ENCOUNTER — Encounter: Payer: Self-pay | Admitting: Physical Therapy

## 2016-10-04 ENCOUNTER — Ambulatory Visit: Payer: PRIVATE HEALTH INSURANCE | Admitting: Physical Therapy

## 2016-10-04 DIAGNOSIS — M6281 Muscle weakness (generalized): Secondary | ICD-10-CM

## 2016-10-04 DIAGNOSIS — M62838 Other muscle spasm: Secondary | ICD-10-CM

## 2016-10-04 NOTE — Therapy (Signed)
[place] [facility] [address], [place], [address] Phone: [phone]   Fax:  [phone]  Physical Therapy Treatment  Patient Details  Name: Rachel Hinton MRN: [id number] Date of Birth: 27-May-1966 Referring Provider: Jones Broom MD  Encounter Date: 10/04/2016      PT End of Session - 10/04/16 1720    Visit Number 21   Number of Visits 28   Date for PT Re-Evaluation 11/02/16   Authorization Type 21   Authorization Time Period 28 WC   PT Start Time 1628   PT Stop Time 1715   PT Time Calculation (min) 47 min   Activity Tolerance Patient tolerated treatment well;Patient limited by pain   Behavior During Therapy [facility] for tasks assessed/performed      Past Medical History:  Diagnosis Date  . 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 CESAREAN SECTION    . SHOULDER ARTHROSCOPY WITH DISTAL CLAVICLE RESECTION Right 07/19/2016   Procedure: SHOULDER ARTHROSCOPY, DEBRIDEMENT PARTIAL ROTATOR CUFF TEAR, SUBACROMIAL DECOMPRESSION, DISTAL CLAVICAL EXCISION;  Surgeon: Jones Broom, MD;  Location: [facility];  Service: Orthopedics;  Laterality: Right;  . SUBACROMIAL DECOMPRESSION Right 07/19/2016   Procedure: SUBACROMIAL DECOMPRESSION;  Surgeon: Jones Broom, MD;  Location: [facility];  Service: Orthopedics;  Laterality: Right;    There were no vitals filed for this visit.      Subjective Assessment - 10/04/16 1635    Subjective Patient reports she is able to place arm behind back now and is pleased with her progress. She is still limited mild to moderately with daily tasks at home and work and feels she will require additional physical therapy to achieve more goals.    Limitations Lifting;House hold activities  reaching out to side, work related tasks   Patient Stated Goals To be able to use right arm without pain  and back to normal activity   Currently in Pain? No/denies        Objective: Palpation: decreased joint mobility right shoulder posterior glide, inferior glide, decreased thoracic spine PA mobility Observation; mild swelling right shoulder superior> inferior to clavicle  AROM: right shoulder forward elevation 0-165, ER WFL, IR behind back to L4 with effort Strength: improving right shoulder flexion, abduction, rotations and periscapular control, strength Outcome Measures; QuickDash 39% self perceived disability; work module: 38% impaired  Treatment: Manual therapy: goal; decrease spasms, pain, improve ROM STM performed x 5 min. with superficial technique posterior aspect right shoulder prone lying; re assessed self mobilization of thoracic spine over back of chair with patient performing 3 reps each of 3 levels   Therapeutic exercise: performed with verbal and tactile cues and demonstration of therapist OMEGA cable exercises for strengthening; Seated rows bilateral 10# x 15 reps Standing single arm rows 5# each UE x 15 reps Straight arm pull downs 15# x 15 reps Reverse chin ups seated 25# x 20 reps Push ups at wall x 10 reps ER of both UE's with resistive band (blue) x 10 with demonstration for correct alignment. Prone lying right shoulder rows and extension x 15 reps each Supine lying 4# weight x 20 reps forward flexion overhead  Modalities: Electrical stimulation:High volt estim.clincial program for muscle spasms (2) electrodes applied to right shoulder lower trapeziusand posterior aspect of shoulder with patient seated with right UE supported on pillow x 20 min.; goal: pain, spasms;  Iontophoresis with dexamethasone /ml @  61ma*min applied with large electrode to anterior aspect of right shoulder in conjunction with estim. (8 min. to apply); goal pain, swelling(no adverse reaction noted)   Patient response to treatment: Patient demonstrated improved soft tissue  elasticity posterior right shoulder and decreased tenderness to mild/none following STM, Improved motor control and strength with repetition of exercises with VC and demonstration for correct alignment. No increased pain with exercises.        PT Education - 10/04/16 1700    Education provided Yes   Education Details HEP: re assessed wall push ups and scapular retraction/strengthening with increased resistance and given blue band for home   Person(s) Educated Patient   Methods Explanation;Demonstration;Verbal cues   Comprehension Verbalized understanding;Returned demonstration;Verbal cues required             PT Long Term Goals - 10/04/16 1715      PT LONG TERM GOAL #1   Title Patient will demonstrate improved function with right UE for daily tasks as indicated by QuickDash score of 20% or less by 11/02/2016   Baseline initially 77%; crrent 09/06/16 40%; 10/04/16 = 39%   Status Revised     PT LONG TERM GOAL #2   Title Patient will demonstrate full pain free AROM for overhead and behind back with right UE use for personal care and work related activities by 11/02/2016   Baseline initially: unable to raise right UE without pain limiting motion, passive ROM <90 flexion; current 10/04/16 flexion 165, behind back to L4-5 with effortt   Status Revised     PT LONG TERM GOAL #3   Title Patient will be independent with home program for pain control, exercise for right UE to allow self management once discharged from physical therapy by 11/02/2016   Baseline limited knowledge of appropriate exercises, pain control strategies and progression, requires guidance and assistance   Status Revised               Plan - 10/04/16 1727    Clinical Impression Statement Patient demonstrates improving strength/ROM and functional use right UE. She is limited in household chores and work related activities due to decreased strength and ROM of right UE s/p surgery. She continues with limitations of ROM,  strength and functional use of right UE and will benefit from additional physical therapy Intervention to achieve maximal functional return.    Rehab Potential Good   PT Frequency 2x / week   PT Duration 4 weeks   PT Treatment/Interventions Electrical Stimulation;Cryotherapy;Moist Heat;Ultrasound;Patient/family education;Neuromuscular re-education;Therapeutic exercise;Manual techniques;Passive range of motion;Dry needling;Iontophoresis /ml Dexamethasone   PT Next Visit Plan pain control and neromuscular re education, manual techniques for spasms, ROM, therapeutic exercises, iontophoresis   PT Home Exercise Plan posture correction, scapular retraction with use of resistive bands, use of heat/ice; pulleys for ROM; stabilization supine lying, side lying ER, standing scaption, abduction and ER at wall      Patient will benefit from skilled therapeutic intervention in order to improve the following deficits and impairments:  Decreased strength, Impaired flexibility, Pain, Impaired perceived functional ability, Decreased activity tolerance, Increased muscle spasms, Impaired UE functional use, Decreased range of motion  Visit Diagnosis: Muscle weakness (generalized) - Plan: PT plan of care cert/re-cert  Other muscle spasm - Plan: PT plan of care cert/re-cert     Problem List There are no active problems to display for this patient.   Beacher May PT 10/05/2016, 9:44 PM  [place] [facility] [address] [place], [place], [address] Phone: [phone]   Fax:  [phone]  Name: Adelei Scobey MRN: [id number] Date of Birth: [birth year]

## 2016-10-07 ENCOUNTER — Encounter: Payer: Self-pay | Admitting: Physical Therapy

## 2016-10-07 ENCOUNTER — Ambulatory Visit: Payer: PRIVATE HEALTH INSURANCE | Admitting: Physical Therapy

## 2016-10-07 DIAGNOSIS — M6281 Muscle weakness (generalized): Secondary | ICD-10-CM

## 2016-10-07 DIAGNOSIS — M62838 Other muscle spasm: Secondary | ICD-10-CM

## 2016-10-07 DIAGNOSIS — M25511 Pain in right shoulder: Secondary | ICD-10-CM

## 2016-10-07 NOTE — Therapy (Signed)
[place] [facility] PHYSICAL AND SPORTS MEDICINE [address] [place], [address] Phone: [phone]   Fax:  [phone]  Physical Therapy Treatment  Patient Details  Name: Rachel Hinton MRN: [id number] Date of Birth: 1965/10/30 Referring Provider: Jones Broom MD  Encounter Date: 10/07/2016      PT End of Session - 10/07/16 0900    Visit Number 22   Number of Visits 28   Date for PT Re-Evaluation 11/02/16   Authorization Type 22   Authorization Time Period 28 WC   PT Start Time 0802   PT Stop Time 0903   PT Time Calculation (min) 61 min   Activity Tolerance Patient tolerated treatment well;Patient limited by pain   Behavior During Therapy [facility] for tasks assessed/performed      Past Medical History:  Diagnosis Date  . 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 CESAREAN SECTION    . SHOULDER ARTHROSCOPY WITH DISTAL CLAVICLE RESECTION Right 07/19/2016   Procedure: SHOULDER ARTHROSCOPY, DEBRIDEMENT PARTIAL ROTATOR CUFF TEAR, SUBACROMIAL DECOMPRESSION, DISTAL CLAVICAL EXCISION;  Surgeon: Jones Broom, MD;  Location: [facility];  Service: Orthopedics;  Laterality: Right;  . SUBACROMIAL DECOMPRESSION Right 07/19/2016   Procedure: SUBACROMIAL DECOMPRESSION;  Surgeon: Jones Broom, MD;  Location: [facility];  Service: Orthopedics;  Laterality: Right;    There were no vitals filed for this visit.      Subjective Assessment - 10/07/16 0808    Subjective Patient reports she is still having stiffness in right shoulder with raising arm overhead. overall she is noticing improvement with using right UE for personal care and work related activities.    Limitations Lifting;House hold activities  reaching out to side, work related tasks   Patient Stated Goals To be able to use right arm without pain and back to normal activity   Currently in  Pain? No/denies      Objective: Palpation: decreased joint mobility right shoulder posterior glide, inferior glide, decreased thoracic spine PA mobility Observation; mild swelling right shoulder superior>inferior to clavicle  AROM: right shoulder forward elevation 0-165, ER WFL, IR behind back to L4 with effort Strength: improving right shoulder flexion, abduction, rotations and periscapular control, strength Outcome Measures; QuickDash 39% self perceived disability; work module: 38% impaired  Treatment: Manual therapy:goal; decrease spasms, pain, improve ROM STM performed x 13 min. with superficial technique posterior aspect right shoulder prone lying; thoracic spine mobilization PA mobs grade 2-3, 30 seconds each level; right UE arm pull with patient supine lying DN performed x 3 min.(unbilled) to right upper trapezius: patient prone lying; .25mm x 40 needle used for TrPs (3); patient was informed of technique and all precautions including bleeding, bruising, nausea, lung field precautions and agreed to treatment; decreased spasms/tenderness to palpation following DN Therapeutic exercise: performed with verbal and tactile cues and demonstration of therapist OMEGA cable exercises for strengthening; Seated rows bilateral 10# x 15 reps Standing single arm rows 5# each UE x 15 reps Straight arm pull downs 15# x 15 reps Reverse chin ups seated 25# x 20 reps Push ups at wall x 10 reps  Modalities: Iontophoresis with dexamethasone /ml @ 41ma*min applied with large electrode to anterior aspect of right shoulder in conjunction with estim. (8 min. to apply); goal pain, swelling(no adverse reaction noted)  Moist heat applied to shoulder during iontophoresis treatment; no adverse reactions noted  Patient response to treatment: Patient demonstrated improved  soft tissue elasticity posterior right shoulder and decreased tenderness to mild/none following STM, Improved motor control and  strength with repetition of exercises with VC and demonstration for correct alignment. No increased pain with exercises.         PT Education - 10/07/16 0845    Education provided Yes   Education Details HEP; re assessed wall push ups fo rcorrect shoulder alignment and monitor effort/strength of right shoulder during movement.    Person(s) Educated Patient   Methods Explanation;Demonstration   Comprehension Verbalized understanding;Returned demonstration             PT Long Term Goals - 10/04/16 1715      PT LONG TERM GOAL #1   Title Patient will demonstrate improved function with right UE for daily tasks as indicated by QuickDash score of 20% or less by 11/02/2016   Baseline initially 77%; crrent 09/06/16 40%; 10/04/16 = 39%   Status Revised     PT LONG TERM GOAL #2   Title Patient will demonstrate full pain free AROM for overhead and behind back with right UE use for personal care and work related activities by 11/02/2016   Baseline initially: unable to raise right UE without pain limiting motion, passive ROM <90 flexion; current 10/04/16 flexion 165, behind back to L4-5 with effortt   Status Revised     PT LONG TERM GOAL #3   Title Patient will be independent with home program for pain control, exercise for right UE to allow self management once discharged from physical therapy by 11/02/2016   Baseline limited knowledge of appropriate exercises, pain control strategies and progression, requires guidance and assistance   Status Revised               Plan - 10/07/16 0910    Clinical Impression Statement Patient demonstrates improvement with decreased spasms, improving AROM  and functional use of right UE. She continues with swelling in anterior aspect of right upper trapezius above clavicle and increased spasms in upper trapeizus and decreased strength in right shoulder girdle. She will benefit from additional physical thearpy interventoin to address limitations and achieve  goals.    Rehab Potential Good   PT Frequency 2x / week   PT Duration 4 weeks   PT Treatment/Interventions Electrical Stimulation;Cryotherapy;Moist Heat;Ultrasound;Patient/family education;Neuromuscular re-education;Therapeutic exercise;Manual techniques;Passive range of motion;Dry needling;Iontophoresis /ml Dexamethasone   PT Next Visit Plan pain control and neromuscular re education, manual techniques for spasms, ROM, therapeutic exercises, iontophoresis   PT Home Exercise Plan posture correction, scapular retraction with use of resistive bands, use of heat/ice; pulleys for ROM; stabilization supine lying, side lying ER, standing scaption, abduction and ER at wall      Patient will benefit from skilled therapeutic intervention in order to improve the following deficits and impairments:  Decreased strength, Impaired flexibility, Pain, Impaired perceived functional ability, Decreased activity tolerance, Increased muscle spasms, Impaired UE functional use, Decreased range of motion  Visit Diagnosis: Muscle weakness (generalized)  Other muscle spasm  Acute pain of right shoulder     Problem List There are no active problems to display for this patient.   Beacher May PT 10/08/2016, 3:01 PM  [place] [facility] PHYSICAL AND SPORTS MEDICINE [address] [place], [address] Phone: [phone]   Fax:  [phone]  Name: Rachel Hinton MRN: [id number] Date of Birth: [birth year]

## 2016-10-12 ENCOUNTER — Ambulatory Visit: Payer: PRIVATE HEALTH INSURANCE | Admitting: Physical Therapy

## 2016-10-12 ENCOUNTER — Encounter: Payer: Self-pay | Admitting: Physical Therapy

## 2016-10-12 DIAGNOSIS — M62838 Other muscle spasm: Secondary | ICD-10-CM

## 2016-10-12 DIAGNOSIS — M6281 Muscle weakness (generalized): Secondary | ICD-10-CM

## 2016-10-13 ENCOUNTER — Ambulatory Visit: Payer: Self-pay | Admitting: Physical Therapy

## 2016-10-13 NOTE — Therapy (Signed)
[place] [facility]TS [facility]IC[facility] [address] [place], [address] Phone: [phone]   Fax:  [phone]  Physical Therapy Treatment  Patient Details  Name: Rachel Hinton MRN: [id number] Date of Birth: 1966/03/08 Referring Provider: Jones Broom MD  Encounter Date: 10/12/2016      PT End of Session - 10/12/16 0927    Visit Number 23   Number of Visits 28   Date for PT Re-Evaluation 11/02/16   Authorization Type 23   Authorization Time Period 28 WC   PT Start Time 0920   PT Stop Time 1020   PT Time Calculation (min) 60 min   Activity Tolerance Patient tolerated treatment well;Patient limited by pain   Behavior During Therapy [facility] for tasks assessed/performed      Past Medical History:  Diagnosis Date  . 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 CESAREAN SECTION    . SHOULDER ARTHROSCOPY WITH DISTAL CLAVICLE RESECTION Right 07/19/2016   Procedure: SHOULDER ARTHROSCOPY, DEBRIDEMENT PARTIAL ROTATOR CUFF TEAR, SUBACROMIAL DECOMPRESSION, DISTAL CLAVICAL EXCISION;  Surgeon: Jones Broom, MD;  Location: [facility];  Service: Orthopedics;  Laterality: Right;  . SUBACROMIAL DECOMPRESSION Right 07/19/2016   Procedure: SUBACROMIAL DECOMPRESSION;  Surgeon: Jones Broom, MD;  Location: [facility];  Service: Orthopedics;  Laterality: Right;    There were no vitals filed for this visit.      Subjective Assessment - 10/12/16 0926    Subjective Patient reports she is  having less stiffness in right shoulder with raising arm overhead. She is noticing improvement with using right UE for personal care and work related activities and was able to vacuum for the first time since surgery with minimal to no discomfort    Limitations Lifting;House hold activities  reaching out to side, work related tasks   Patient Stated Goals To be able  to use right arm without pain and back to normal activity   Currently in Pain? No/denies      Objective: Observation: mild swelling anterior aspect of shoulder Palpation: increased spasms right upper trapezius and mild swelling noted right shoulder, anterior aspect  Treatment: Manual therapy:goal; decrease spasms, pain, improve ROM STM performed x 10 min. with superficial technique posterior aspect right shoulder and posterior aspect of cervical spine with suboccipital release; patient supine lying;   Therapeutic exercise: performed with verbal and tactile cues and demonstration of therapist OMEGA cable exercises for strengthening; Standing single arm rows 10# each UE x 15 reps Reverse chin ups seated 25# x 20 reps Push ups at wall x 10 reps ER of both UE's with resistive band (blue) x 10 with demonstration for correct alignment Shoulder stretch at door frame forward elevation to end range then lift off wall, 3 different angles, x 3-5 reps each angle  Modalities: Electrical stimulation:High volt estim.clincial program for muscle spasms (2) electrodes applied to right shoulder lower trapeziusand medial border of scapula with patient seated with right UE supported on pillow x 20 min.; goal: pain, spasms;  Iontophoresis with dexamethasone /ml @ 28ma*min applied with large electrode to anterior aspect of right shoulder in conjunction with estim. (8 min. to apply); goal pain, swelling(no adverse reaction noted)   Patient response to treatment:  Patient demonstrated improved soft tissue elasticity right shoulder, upper trapezius muscle, cervical spine. Improved motor control and strength with repetition and minimal VC.  Mild increased soreness in right shoulder with stretches.  PT Education - 10/12/16 1020    Education Details HEP; added stretch on door frame to improve forward elevaiton, re assessed push ups off wall   Person(s) Educated Patient   Methods  Explanation;Demonstration;Verbal cues   Comprehension Verbalized understanding;Returned demonstration;Verbal cues required             PT Long Term Goals - 10/04/16 1715      PT LONG TERM GOAL #1   Title Patient will demonstrate improved function with right UE for daily tasks as indicated by QuickDash score of 20% or less by 11/02/2016   Baseline initially 77%; crrent 09/06/16 40%; 10/04/16 = 39%   Status Revised     PT LONG TERM GOAL #2   Title Patient will demonstrate full pain free AROM for overhead and behind back with right UE use for personal care and work related activities by 11/02/2016   Baseline initially: unable to raise right UE without pain limiting motion, passive ROM <90 flexion; current 10/04/16 flexion 165, behind back to L4-5 with effortt   Status Revised     PT LONG TERM GOAL #3   Title Patient will be independent with home program for pain control, exercise for right UE to allow self management once discharged from physical therapy by 11/02/2016   Baseline limited knowledge of appropriate exercises, pain control strategies and progression, requires guidance and assistance   Status Revised               Plan - 10/12/16 0927    Clinical Impression Statement Patient demonstrates improving strength, ROM and able to perform all exercises with minimal VC and assistance. She is progressing well towards goals.    Rehab Potential Good   PT Frequency 2x / week   PT Duration 4 weeks   PT Treatment/Interventions Electrical Stimulation;Cryotherapy;Moist Heat;Ultrasound;Patient/family education;Neuromuscular re-education;Therapeutic exercise;Manual techniques;Passive range of motion;Dry needling;Iontophoresis /ml Dexamethasone   PT Next Visit Plan pain control and neromuscular re education, manual techniques for spasms, ROM, therapeutic exercises, iontophoresis   PT Home Exercise Plan posture correction, scapular retraction with use of resistive bands, use of heat/ice;  pulleys for ROM; stabilization supine lying, side lying ER, standing scaption, abduction and ER at wall      Patient will benefit from skilled therapeutic intervention in order to improve the following deficits and impairments:  Decreased strength, Impaired flexibility, Pain, Impaired perceived functional ability, Decreased activity tolerance, Increased muscle spasms, Impaired UE functional use, Decreased range of motion  Visit Diagnosis: Muscle weakness (generalized)  Other muscle spasm     Problem List There are no active problems to display for this patient.   Beacher May PT 10/13/2016, 1:50 PM  [place] [facility] PHYSICAL AND SPORTS MEDICINE [address], [place], [address] Phone: [phone]   Fax:  [phone]  Name: Rachel Hinton MRN: [id number] Date of Birth: 08/03/1965

## 2016-10-15 ENCOUNTER — Encounter: Payer: Self-pay | Admitting: Physical Therapy

## 2016-10-15 ENCOUNTER — Ambulatory Visit: Payer: PRIVATE HEALTH INSURANCE | Admitting: Physical Therapy

## 2016-10-15 DIAGNOSIS — M6281 Muscle weakness (generalized): Secondary | ICD-10-CM

## 2016-10-15 DIAGNOSIS — M62838 Other muscle spasm: Secondary | ICD-10-CM

## 2016-10-16 NOTE — Therapy (Signed)
[place] [facility] PHYS[facility]ORTS MEDICINE [address] [place], [address] Phone: [phone]   Fax:  [phone]  Physical Therapy Treatment  Patient Details  Name: Rachel Hinton MRN: [id number] Date of Birth: [birth year] Referring Provider: Jones Broom MD  Encounter Date: 10/15/2016      PT End of Session - 10/15/16 0918    Visit Number 24   Number of Visits 28   Date for PT Re-Evaluation 11/02/16   Authorization Type 24   Authorization Time Period 28 WC   PT Start Time 0914   PT Stop Time 1010   PT Time Calculation (min) 56 min   Activity Tolerance Patient tolerated treatment well;Patient limited by pain   Behavior During Therapy [facility] for tasks assessed/performed      Past Medical History:  Diagnosis Date  . 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 CESAREAN SECTION    . SHOULDER ARTHROSCOPY WITH DISTAL CLAVICLE RESECTION Right 07/19/2016   Procedure: SHOULDER ARTHROSCOPY, DEBRIDEMENT PARTIAL ROTATOR CUFF TEAR, SUBACROMIAL DECOMPRESSION, DISTAL CLAVICAL EXCISION;  Surgeon: Jones Broom, MD;  Location: [facility];  Service: Orthopedics;  Laterality: Right;  . SUBACROMIAL DECOMPRESSION Right 07/19/2016   Procedure: SUBACROMIAL DECOMPRESSION;  Surgeon: Jones Broom, MD;  Location: [facility];  Service: Orthopedics;  Laterality: Right;    There were no vitals filed for this visit.      Subjective Assessment - 10/15/16 0916    Subjective Patient reports she is improving overall with mild stiffness in right shoulder and more comfortable with movement.    Limitations Lifting;House hold activities  reaching out to side, work related tasks   Patient Stated Goals To be able to use right arm without pain and back to normal activity   Currently in Pain? No/denies         Objective: Observation: mild swelling anterior  aspect of shoulder, more localized to upper trapezius region Palpation: increased spasms right upper trapezius and infraspinatus, cervical spine paraspinal muscles, suboccipital muscles right>left AROM: right shoulder 0-165 forward elevation pre treatment, 0-170 post treatment with mild to no stiffness reported  Treatment: Manual therapy:goal; decrease spasms, pain, improve ROM STM performed x . with superficial technique posterior aspect of cervical spine with suboccipital release, manual stretching right upper trapezius x 3 reps; patient supinelying; goals improve soft tissue elasticity, decrease spasms, improve ROM   DN performed x 3 min. To right upper trapezius and infraspinatus with patient positioned prone lying: unbilled time: goal decrease TrP to improve ROM right shoulder; no adverse reactions noted; patient reported feeling looser and able to raise arm overhead without stiffness  Therapeutic exercise:performed with verbal and tactile cues and demonstration of therapist OMEGA cable exercises for strengthening; Standing single arm rows 10# right UE x 15 reps, 15# left UE x 15 reps Reverse chin ups seated 25# x 20 reps Standing straight arm pull downs 20# x 15 reps Seated row 20# x10, 15# x 15 reps Push ups at wall with feet further from wall (18")x 10 reps  Modalities: Electrical stimulation:High volt estim.clincial program for muscle spasms (2) electrodes applied to right shoulder lower trapeziusand posterior aspect of shoulder over teres/infraspinatus muscles with patient seated with right UE supported on pillow x 20 min.; moist heat applied in conjunction with estim.; no adverse reactions noted; goal: pain, spasms;  Iontophoresis with dexamethasone /ml @ 55ma*min applied with large electrode to upper trapezius right  shoulder in conjunction with estim. (8 min. to apply); goal pain, swelling(no adverse reaction noted)   Patient response to treatment: Patient  demonstrated improved flexibility with decreased stiffness and pain reported in right shoulder with overhead ROM. Improved soft tissue elasticity by >50% following treatment. Improved motor control with exercises with repetition and VC for correct alignment of shoulders.       PT Education - 10/15/16 0945    Education provided Yes   Education Details HEP: reassessed home exercises, performing cable exercises   Person(s) Educated Patient   Methods Explanation;Demonstration;Verbal cues   Comprehension Verbalized understanding;Returned demonstration;Verbal cues required             PT Long Term Goals - 10/04/16 1715      PT LONG TERM GOAL #1   Title Patient will demonstrate improved function with right UE for daily tasks as indicated by QuickDash score of 20% or less by 11/02/2016   Baseline initially 77%; crrent 09/06/16 40%; 10/04/16 = 39%   Status Revised     PT LONG TERM GOAL #2   Title Patient will demonstrate full pain free AROM for overhead and behind back with right UE use for personal care and work related activities by 11/02/2016   Baseline initially: unable to raise right UE without pain limiting motion, passive ROM <90 flexion; current 10/04/16 flexion 165, behind back to L4-5 with effortt   Status Revised     PT LONG TERM GOAL #3   Title Patient will be independent with home program for pain control, exercise for right UE to allow self management once discharged from physical therapy by 11/02/2016   Baseline limited knowledge of appropriate exercises, pain control strategies and progression, requires guidance and assistance   Status Revised               Plan - 10/15/16 0919    Clinical Impression Statement Patient is progressing with goals with improving function with daily activies indicating increasing strength and flexibility. Improving soft tissue elasticity and decreasing swelling noted in right shoulder with more localized swelling currently. She should continue  to improve with additional physical therapy intervention.    Rehab Potential Good   PT Frequency 2x / week   PT Duration 4 weeks   PT Treatment/Interventions Electrical Stimulation;Cryotherapy;Moist Heat;Ultrasound;Patient/family education;Neuromuscular re-education;Therapeutic exercise;Manual techniques;Passive range of motion;Dry needling;Iontophoresis /ml Dexamethasone   PT Next Visit Plan pain control and neromuscular re education, manual techniques for spasms, ROM, therapeutic exercises, iontophoresis   PT Home Exercise Plan posture correction, scapular retraction with use of resistive bands, use of heat/ice; pulleys for ROM; stabilization supine lying, side lying ER, standing scaption, abduction and ER at wall      Patient will benefit from skilled therapeutic intervention in order to improve the following deficits and impairments:  Decreased strength, Impaired flexibility, Pain, Impaired perceived functional ability, Decreased activity tolerance, Increased muscle spasms, Impaired UE functional use, Decreased range of motion  Visit Diagnosis: Muscle weakness (generalized)  Other muscle spasm     Problem List There are no active problems to display for this patient.   Beacher May PT 10/16/2016, 8:54 AM  [place] PHYSICAL AND SPORTS MEDICINE [address] [place], [address] Phone: [phone]   Fax:  [phone]  Name: Kymoni Lesperance MRN: [id number] Date of Birth: Aug 19, 1965

## 2016-10-18 ENCOUNTER — Encounter: Payer: PRIVATE HEALTH INSURANCE | Admitting: Physical Therapy

## 2016-10-19 ENCOUNTER — Ambulatory Visit: Payer: PRIVATE HEALTH INSURANCE | Admitting: Physical Therapy

## 2016-10-19 ENCOUNTER — Encounter: Payer: Self-pay | Admitting: Physical Therapy

## 2016-10-19 DIAGNOSIS — M6281 Muscle weakness (generalized): Secondary | ICD-10-CM

## 2016-10-19 DIAGNOSIS — M62838 Other muscle spasm: Secondary | ICD-10-CM

## 2016-10-20 ENCOUNTER — Ambulatory Visit: Payer: Self-pay | Admitting: Physical Therapy

## 2016-10-20 NOTE — Therapy (Signed)
[facility] [address] [place], [address] Phone: [phone]   Fax:  [phone]  Physical Therapy Treatment  Patient Details  Name: Rachel Hinton MRN: [id number] Date of Birth: [birth year] Referring Provider: Jones Broom MD  Encounter Date: 10/19/2016      PT End of Session - 10/19/16 1043    Visit Number 25   Number of Visits 28   Date for PT Re-Evaluation 11/02/16   Authorization Type 25   Authorization Time Period 28 WC   PT Start Time 1038   PT Stop Time 1125   PT Time Calculation (min) 47 min   Activity Tolerance Patient tolerated treatment well;Patient limited by pain   Behavior During Therapy [facility] for tasks assessed/performed      Past Medical History:  Diagnosis Date  . 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 CESAREAN SECTION    . SHOULDER ARTHROSCOPY WITH DISTAL CLAVICLE RESECTION Right 07/19/2016   Procedure: SHOULDER ARTHROSCOPY, DEBRIDEMENT PARTIAL ROTATOR CUFF TEAR, SUBACROMIAL DECOMPRESSION, DISTAL CLAVICAL EXCISION;  Surgeon: Jones Broom, MD;  Location: [facility];  Service: Orthopedics;  Laterality: Right;  . SUBACROMIAL DECOMPRESSION Right 07/19/2016   Procedure: SUBACROMIAL DECOMPRESSION;  Surgeon: Jones Broom, MD;  Location: [facility];  Service: Orthopedics;  Laterality: Right;    There were no vitals filed for this visit.      Subjective Assessment - 10/19/16 1040    Subjective Patient reports noticing improvement with being able to raise arm out to side and was able to hang curtain rod at home with 2 hands and able to hold rod with one hand.    Limitations Lifting;House hold activities  reaching out to side, work related tasks   Patient Stated Goals To be able to use right arm without pain and back to normal activity   Currently in Pain? No/denies  only a  little soreness in right upper arm      Objective Observation: mild swelling noted anterior to upper trapezius, superior to clavicle right shoulder Palpation; right shoulder, infraspinatus and upper trapezius with decreased soft tissue mobility with mild spasms palpable, SCM, scalenes shortened bilaterally  Treatment: Manual therapy:goal; decrease spasms, pain, improve ROM STM performed x . with superficial technique posterior aspect of cervical spine with suboccipital release and compression to TrPs upper trapezius, infraspinatus, SCM, scalenesmanual stretching right upper trapezius x 3 reps; patient supine lying and side lying;   Therapeutic exercise:performed with verbal and tactile cues and demonstration of therapist; goal: independent with home program, ROM, strength OMEGA cable exercises for strengthening; Standing single arm rows 10# right UE x 15 reps, 15# left UE x 15 reps Reverse chin ups seated 25# x 20 reps Standing straight arm pull downs 20# x 15 reps Seated row 20# x10, 15# x 15 reps Palloff press with 15# pull to chest facing cable pull, 15 reps, standing with proper alignment of trunk, shoulders Push ups at wall with feet further from wall (18")x 10 reps  Modalities: Electrical stimulation:High volt estim.clincial program for muscle spasms (2) electrodes applied to right shoulder lower trapeziusand posterior aspect of shoulder over teres/infraspinatus muscles with patient seated with right UE supported on pillow x 20 min.; moist heat applied in conjunction with estim.; no adverse reactions noted; goal: pain, spasms;  Iontophoresis with dexamethasone /ml @ 24ma*min applied with large electrode to upper trapezius right shoulder in conjunction with  estim. (8 min. to apply); goal pain, swelling(no adverse reaction noted)  Patient response to treatment: Patient demonstrated improved soft tissue elasticity following STM and was able to perform all exercises with  good technique with minimal VC for correct alignment. No increased pain reported during session.         PT Education - 10/19/16 1045    Education provided Yes   Education Details HEP: use of ball to self massage trigger points, tight tissue posterior shoulder   Person(s) Educated Patient   Methods Explanation;Demonstration   Comprehension Verbalized understanding             PT Long Term Goals - 10/04/16 1715      PT LONG TERM GOAL #1   Title Patient will demonstrate improved function with right UE for daily tasks as indicated by QuickDash score of 20% or less by 11/02/2016   Baseline initially 77%; crrent 09/06/16 40%; 10/04/16 = 39%   Status Revised     PT LONG TERM GOAL #2   Title Patient will demonstrate full pain free AROM for overhead and behind back with right UE use for personal care and work related activities by 11/02/2016   Baseline initially: unable to raise right UE without pain limiting motion, passive ROM <90 flexion; current 10/04/16 flexion 165, behind back to L4-5 with effortt   Status Revised     PT LONG TERM GOAL #3   Title Patient will be independent with home program for pain control, exercise for right UE to allow self management once discharged from physical therapy by 11/02/2016   Baseline limited knowledge of appropriate exercises, pain control strategies and progression, requires guidance and assistance   Status Revised               Plan - 10/19/16 1108    Clinical Impression Statement Patient demonstrates good carry over between sessions as demonstrated by improved flexibiltiy and strength in right shoulder with exercises during session. She continues with mild swelling superior aspect of right shoulder anterior to upper trapezius, decreased strength right UE as compared to left and will benefit from continued physical therapy to address these and prepare to transition to independent home program.    Rehab Potential Good   PT Frequency 2x / week    PT Duration 4 weeks   PT Treatment/Interventions Electrical Stimulation;Cryotherapy;Moist Heat;Ultrasound;Patient/family education;Neuromuscular re-education;Therapeutic exercise;Manual techniques;Passive range of motion;Dry needling;Iontophoresis /ml Dexamethasone   PT Next Visit Plan pain control and neromuscular re education, manual techniques for spasms, ROM, therapeutic exercises, iontophoresis   PT Home Exercise Plan posture correction, scapular retraction with use of resistive bands, use of heat/ice; pulleys for ROM; stabilization supine lying, side lying ER, standing scaption, abduction and ER at wall      Patient will benefit from skilled therapeutic intervention in order to improve the following deficits and impairments:  Decreased strength, Impaired flexibility, Pain, Impaired perceived functional ability, Decreased activity tolerance, Increased muscle spasms, Impaired UE functional use, Decreased range of motion  Visit Diagnosis: Muscle weakness (generalized)  Other muscle spasm     Problem List There are no active problems to display for this patient.   Beacher May PT 10/20/2016, 7:59 AM  [place] [facility] PHYSICAL AND SPORTS MEDICINE [address], [place], [address] Phone: [phone]   Fax:  [phone]  Name: Rachel Hinton MRN: [id number] Date of Birth: 06-27-1966

## 2016-10-21 ENCOUNTER — Ambulatory Visit: Payer: PRIVATE HEALTH INSURANCE | Admitting: Physical Therapy

## 2016-10-21 ENCOUNTER — Encounter: Payer: Self-pay | Admitting: Physical Therapy

## 2016-10-21 DIAGNOSIS — M62838 Other muscle spasm: Secondary | ICD-10-CM

## 2016-10-21 DIAGNOSIS — M6281 Muscle weakness (generalized): Secondary | ICD-10-CM

## 2016-10-21 NOTE — Therapy (Signed)
[facility] [address], [place], [address] Phone: [phone]   Fax:  [phone]  Physical Therapy Treatment  Patient Details  Name: Rachel Hinton MRN: [id number] Date of Birth: 05/04/1966 Referring Provider: Jones Broom MD  Encounter Date: 10/21/2016      PT End of Session - 10/21/16 0815    Visit Number 26   Number of Visits 28   Date for PT Re-Evaluation 11/02/16   Authorization Type 26   Authorization Time Period 28 WC   PT Start Time 0805   PT Stop Time 0905   PT Time Calculation (min) 60 min   Activity Tolerance Patient tolerated treatment well;Patient limited by pain   Behavior During Therapy [facility] for tasks assessed/performed      Past Medical History:  Diagnosis Date  . 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 CESAREAN SECTION    . SHOULDER ARTHROSCOPY WITH DISTAL CLAVICLE RESECTION Right 07/19/2016   Procedure: SHOULDER ARTHROSCOPY, DEBRIDEMENT PARTIAL ROTATOR CUFF TEAR, SUBACROMIAL DECOMPRESSION, DISTAL CLAVICAL EXCISION;  Surgeon: Jones Broom, MD;  Location: [facility];  Service: Orthopedics;  Laterality: Right;  . SUBACROMIAL DECOMPRESSION Right 07/19/2016   Procedure: SUBACROMIAL DECOMPRESSION;  Surgeon: Jones Broom, MD;  Location: [facility];  Service: Orthopedics;  Laterality: Right;    There were no vitals filed for this visit.      Subjective Assessment - 10/21/16 0812    Subjective Patient reports she is able to vacuum more space, 10 min. to 20 min. . without pain in right shoulder. She is feeling more normal and just aching from use, decreased endurance.    Limitations Lifting;House hold activities  reaching out to side, work related tasks   Patient Stated Goals To be able to use right arm without pain and back to normal activity   Currently in Pain? No/denies       Observation: mild swelling noted anterior to upper trapezius, superior to clavicle right shoulder Palpation; right shoulder, infraspinatus and upper trapezius with decreased soft tissue mobility with mild spasms palpable, SCM, scalenes shortened bilaterally  Treatment: Manual therapy:goal; decrease spasms, pain, improve ROM STM performed x . with superficial technique posterior aspect of cervical spine with suboccipital release and compression to TrPs upper trapezius, infraspinatus, SCM, scalenesmanual stretching right upper trapezius and scalenes x 3 reps; patient supine lying and side lying;   Therapeutic exercise:performed with verbal and tactile cues and demonstration of therapist; goal: independent with home program, ROM, strength OMEGA cable exercises for strengthening; Standing single arm rows 10# rightUE x 15 reps, 15# left UE x 15 reps Reverse chin ups seated 25# x 20 reps Standing straight arm pull downs 20# x 15 reps Seated row 15# x 15 reps Palloff press with 15# pull to chest facing cable pull, 15 reps, standing with proper alignment of trunk, shoulders Push ups at wall with feet further from wall (18")x 10 reps Endurance exercises with UE's at 90 degrees abduction x 10 reps each Lower trapezius exercise off wall with lat stretch following  (10 reps each UE then 10 reps bilateral)  Modalities: Electrical stimulation:High volt estim.clincial program for muscle spasms (2) electrodes applied to right shoulder lower trapeziusand posterior aspect of shoulder over teres/infraspinatus muscles with patient seated with right UE supported on pillow x 20 min.; moist heat applied in conjunction with estim.; no adverse reactions noted; goal: pain, spasms;  Iontophoresis with dexamethasone /ml @ 51ma*min applied with large electrode to upper trapezius right shoulder in conjunction with estim. (5 min. to apply); goal pain, swelling(no adverse reaction noted)  Patient  response to treatment: Patient demonstrated improved soft tissue elasticity following STM and was able to perform all exercises with good technique with minimal VC for correct alignment. Improved motor control and performance of lower trapezius exercise off wall with repetition.  No increased pain reported during session.        PT Education - 10/21/16 0845    Education provided Yes   Education Details exercises for endurance UE's, lat stretch on wall with lower trapezius exercise off wall   Person(s) Educated Patient   Methods Explanation;Demonstration;Verbal cues   Comprehension Verbalized understanding;Returned demonstration;Verbal cues required             PT Long Term Goals - 10/04/16 1715      PT LONG TERM GOAL #1   Title Patient will demonstrate improved function with right UE for daily tasks as indicated by QuickDash score of 20% or less by 11/02/2016   Baseline initially 77%; crrent 09/06/16 40%; 10/04/16 = 39%   Status Revised     PT LONG TERM GOAL #2   Title Patient will demonstrate full pain free AROM for overhead and behind back with right UE use for personal care and work related activities by 11/02/2016   Baseline initially: unable to raise right UE without pain limiting motion, passive ROM <90 flexion; current 10/04/16 flexion 165, behind back to L4-5 with effortt   Status Revised     PT LONG TERM GOAL #3   Title Patient will be independent with home program for pain control, exercise for right UE to allow self management once discharged from physical therapy by 11/02/2016   Baseline limited knowledge of appropriate exercises, pain control strategies and progression, requires guidance and assistance   Status Revised               Plan - 10/21/16 0908    Clinical Impression Statement Patient continues to demonstrate steady progress towards all goals with improvement in flexibility, strength and functional use right UE. She continues with swelling right shoulder  near upper trapezius, superior to clavicle and decreased strength right periscapular muscles and UE and will benefit from additional physical therpay intervention to achieve goals.    Rehab Potential Good   PT Frequency 2x / week   PT Duration 4 weeks   PT Treatment/Interventions Electrical Stimulation;Cryotherapy;Moist Heat;Ultrasound;Patient/family education;Neuromuscular re-education;Therapeutic exercise;Manual techniques;Passive range of motion;Dry needling;Iontophoresis /ml Dexamethasone   PT Next Visit Plan pain control and neromuscular re education, manual techniques for spasms, ROM, therapeutic exercises, iontophoresis   PT Home Exercise Plan posture correction, scapular retraction with use of resistive bands, use of heat/ice; pulleys for ROM; stabilization supine lying, side lying ER, standing scaption, abduction and ER at wall      Patient will benefit from skilled therapeutic intervention in order to improve the following deficits and impairments:  Decreased strength, Impaired flexibility, Pain, Impaired perceived functional ability, Decreased activity tolerance, Increased muscle spasms, Impaired UE functional use, Decreased range of motion  Visit Diagnosis: Muscle weakness (generalized)  Other muscle spasm     Problem List There are no active problems to display for this patient.   Beacher May PT 10/22/2016, 1:14 PM  [facility] PHYSICAL AND SPORTS MEDICINE [address] [place], [address] Phone: [phone]   Fax:  [phone]  Name: Rachel Hinton MRN: [id number] Date of Birth: [birth year]

## 2016-10-25 ENCOUNTER — Ambulatory Visit: Payer: PRIVATE HEALTH INSURANCE | Admitting: Physical Therapy

## 2016-10-25 ENCOUNTER — Encounter: Payer: Self-pay | Admitting: Physical Therapy

## 2016-10-25 DIAGNOSIS — M6281 Muscle weakness (generalized): Secondary | ICD-10-CM

## 2016-10-25 DIAGNOSIS — M62838 Other muscle spasm: Secondary | ICD-10-CM

## 2016-10-25 NOTE — Therapy (Signed)
[facility] [address], [place], [address] Phone: [phone]   Fax:  [phone]  Physical Therapy Treatment  Patient Details  Name: Rachel Hinton MRN: [id number] Date of Birth: 1966/04/14 Referring Provider: Jones Broom MD  Encounter Date: 10/25/2016      PT End of Session - 10/25/16 1051    Visit Number 27   Number of Visits 28   Date for PT Re-Evaluation 11/02/16   Authorization Type 27   Authorization Time Period 28 WC   PT Start Time 1035   PT Stop Time 1123   PT Time Calculation (min) 48 min   Activity Tolerance Patient tolerated treatment well;Patient limited by pain   Behavior During Therapy [facility] for tasks assessed/performed      Past Medical History:  Diagnosis Date  . 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 CESAREAN SECTION    . SHOULDER ARTHROSCOPY WITH DISTAL CLAVICLE RESECTION Right 07/19/2016   Procedure: SHOULDER ARTHROSCOPY, DEBRIDEMENT PARTIAL ROTATOR CUFF TEAR, SUBACROMIAL DECOMPRESSION, DISTAL CLAVICAL EXCISION;  Surgeon: Jones Broom, MD;  Location: [facility];  Service: Orthopedics;  Laterality: Right;  . SUBACROMIAL DECOMPRESSION Right 07/19/2016   Procedure: SUBACROMIAL DECOMPRESSION;  Surgeon: Jones Broom, MD;  Location: [facility];  Service: Orthopedics;  Laterality: Right;    There were no vitals filed for this visit.      Subjective Assessment - 10/25/16 1040    Subjective Patient reports she continues to see improvement with right UE. She still needs more time to complete tasks that she used to do in less time prior to injury. She is almost back to normal with work related activities.    Limitations Lifting;House hold activities  reaching out to side, work related tasks   Patient Stated Goals To be able to use right arm without pain and back to normal  activity   Currently in Pain? No/denies     Observation: mild swelling noted anterior to upper trapezius, superior to clavicle right shoulder Palpation; right shoulder, upper trapezius with decreased soft tissue mobility with mild spasms palpable, SCM, scalenes shortened bilaterally; improved from previous session  Treatment: Manual therapy:goal; decrease spasms, pain, improve ROM STM performed x . with superficial technique posterior aspect of cervical spine with suboccipital release and compression to TrPs upper trapezius, SCM, scalenes with manual stretching right upper trapezius and scalenes x 2 reps; patient supine lying   Therapeutic exercise:performed with verbal and tactile cues and demonstration of therapist; goal: independent with home program, ROM, strength OMEGA cable exercises for strengthening; Standing single arm rows 15# rightUE x 15 reps, 15# left UE x 15 reps Reverse chin ups seated 25# x 20 reps Standing straight arm pull downs 20# x 15 reps Seated row 15# x 15 reps Palloff press with 20# pull to chest facing cable pull, 15 reps, standing with proper alignment of trunk, shoulders Push ups at wall with feet further from wall (18")x 10 reps Endurance exercises with UE's at 90 degrees abduction x 10 reps each Lower trapezius exercise off wall with lat stretch following  (10 reps each UE then 10 reps bilateral)  Modalities: Moist heat x 10 min following exercises with patient seated in chair with both UE's supported: pain, decrease soreness; no adverse reactions noted  Patient response to treatment: patient demonstrated improved technique with exercises with minimal VC for correct alignment.Patient with decreased spasms by >  50%  following STM. Improved motor control with repetition and cuing for lower trapezius, cable exercises.        PT Education - 10/25/16 1044    Education provided Yes   Education Details continue endurance exercises. lat stretch and  lower trap strengthenin off wall   Person(s) Educated Patient   Methods Explanation;Demonstration;Verbal cues   Comprehension Verbalized understanding;Returned demonstration;Verbal cues required             PT Long Term Goals - 10/04/16 1715      PT LONG TERM GOAL #1   Title Patient will demonstrate improved function with right UE for daily tasks as indicated by QuickDash score of 20% or less by 11/02/2016   Baseline initially 77%; crrent 09/06/16 40%; 10/04/16 = 39%   Status Revised     PT LONG TERM GOAL #2   Title Patient will demonstrate full pain free AROM for overhead and behind back with right UE use for personal care and work related activities by 11/02/2016   Baseline initially: unable to raise right UE without pain limiting motion, passive ROM <90 flexion; current 10/04/16 flexion 165, behind back to L4-5 with effortt   Status Revised     PT LONG TERM GOAL #3   Title Patient will be independent with home program for pain control, exercise for right UE to allow self management once discharged from physical therapy by 11/02/2016   Baseline limited knowledge of appropriate exercises, pain control strategies and progression, requires guidance and assistance   Status Revised               Plan - 10/25/16 1051    Clinical Impression Statement Patient continues to progress steadily towards all goals and is improving with functional use right UE. She continues with swelling right shoulder near upper trapezius, superior to clavicle and decreased strength right periscapular muscles and UE and will benefit from additional physical therpay intervention to achieve goals and transition to independent with home program. .     Rehab Potential Good   PT Frequency 2x / week   PT Duration 4 weeks   PT Treatment/Interventions Electrical Stimulation;Cryotherapy;Moist Heat;Ultrasound;Patient/family education;Neuromuscular re-education;Therapeutic exercise;Manual techniques;Passive range of  motion;Dry needling;Iontophoresis /ml Dexamethasone   PT Next Visit Plan pain control and neromuscular re education, manual techniques for spasms, ROM, therapeutic exercises, iontophoresis   PT Home Exercise Plan posture correction, scapular retraction with use of resistive bands, use of heat/ice; pulleys for ROM; stabilization supine lying, side lying ER, standing scaption, abduction and ER at wall      Patient will benefit from skilled therapeutic intervention in order to improve the following deficits and impairments:  Decreased strength, Impaired flexibility, Pain, Impaired perceived functional ability, Decreased activity tolerance, Increased muscle spasms, Impaired UE functional use, Decreased range of motion  Visit Diagnosis: Muscle weakness (generalized)  Other muscle spasm     Problem List There are no active problems to display for this patient.   Beacher May PT 10/25/2016, 11:49 AM  [place] [facility] PHYSICAL AND SPORTS MEDICINE [address] [place], [address] Phone: [phone]   Fax:  [phone]  Name: Rachel Hinton MRN: [id number] Date of Birth: 03/13/1966

## 2016-10-28 ENCOUNTER — Encounter: Payer: Self-pay | Admitting: Physical Therapy

## 2016-10-28 ENCOUNTER — Ambulatory Visit: Payer: PRIVATE HEALTH INSURANCE | Attending: Orthopedic Surgery | Admitting: Physical Therapy

## 2016-10-28 DIAGNOSIS — M6281 Muscle weakness (generalized): Secondary | ICD-10-CM | POA: Insufficient documentation

## 2016-10-28 DIAGNOSIS — M62838 Other muscle spasm: Secondary | ICD-10-CM | POA: Insufficient documentation

## 2016-10-28 NOTE — Therapy (Signed)
[place] [facility] [address] [place], [place], [address] Phone: [phone]   Fax:  [phone]  Physical Therapy Treatment/Discharge Summary  Patient Details  Name: Rachel Hinton MRN: [id number] Date of Birth: 10-Aug-1965 Referring Provider: Jones Broomhandler, Justin MD  Encounter Date: 10/28/2016   Patient began physical therapy on 07/28/2016 and has attended 28 sessions with goals achieved and patient progressing towards prior level of function. She is independent with home exercise program and self management.       PT End of Session - 10/28/16 1044    Visit Number 28   Number of Visits 28   Date for PT Re-Evaluation 11/02/16   Authorization Type 28   Authorization Time Period 28 WC   PT Start Time 1038   PT Stop Time 1116   PT Time Calculation (min) 38 min   Activity Tolerance Patient tolerated treatment well;Patient limited by pain   Behavior During Therapy WFL for tasks assessed/performed      Past Medical History:  Diagnosis Date  . 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 CESAREAN SECTION    . SHOULDER ARTHROSCOPY WITH DISTAL CLAVICLE RESECTION Right 07/19/2016   Procedure: SHOULDER ARTHROSCOPY, DEBRIDEMENT PARTIAL ROTATOR CUFF TEAR, SUBACROMIAL DECOMPRESSION, DISTAL CLAVICAL EXCISION;  Surgeon: Jones BroomJustin Chandler, MD;  Location: [facility];  Service: Orthopedics;  Laterality: Right;  . SUBACROMIAL DECOMPRESSION Right 07/19/2016   Procedure: SUBACROMIAL DECOMPRESSION;  Surgeon: Jones BroomJustin Chandler, MD;  Location: [facility];  Service: Orthopedics;  Laterality: Right;    There were no vitals filed for this visit.      Subjective Assessment - 10/28/16 1041    Subjective Patient reports she is doing well with right shoulder strength and ROM. She is confident with home exercises and self managment and agrees to discharge from  physical therapy at this time.    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 She noticed that after several hours she was beginning to have problems with cleaning mirrors (above shoulder level) and turning on/off faucettes (reaching and rotation of shoulder). The next day she continued with pain and she was seen by MD on that tuesday and was put in a sling, conservative care with medication, rest, light duty at work. She has had previous PT prior to surgery without lasting results and underwent MRI and then surgery 07/19/2016 and is now referred back for physical therapy.    Limitations Lifting;House hold activities  reaching out to side, work related tasks   Patient Stated Goals To be able to use right arm without pain and back to normal activity   Currently in Pain? No/denies       Observation: mild swelling noted anterior to upper trapezius, superior to clavicle right shoulder Palpation; right shoulder, upper trapezius with decreased soft tissue mobility with mild spasms palpable, SCM, scalenes shortened bilaterally; improved from previous session AROM right shoulder flexion 0-170, IR to L3, left to T7,  Outcome measure: quickdash 9% (initial 77%); work module 6.25% (0 = no self perceived disability)  Treatment: Manual therapy:goal; decrease spasms, pain, improve ROM STM performed x 10min. with superficial technique posterior aspect of cervical spine with suboccipital release and compression to TrPs upper trapezius, SCM, scalenes with manual stretching right upper trapezius and scalenesx  2 reps; patient supine lying   Therapeutic exercise:performed with verbal and tactile cues and demonstration of therapist; goal: independent with home program, ROM, strength OMEGA cable exercises for strengthening; Standing single arm rows 15#  rightUE x 15 reps, 15# left UE x 15 reps Reverse chin ups seated 25# x 20 reps Standing straight arm pull downs 20# x 15 reps Seated row 15# x 15 reps Palloff press with 20# pull to chest facing cable pull, 15 reps, standing with proper alignment of trunk, shoulders Push ups at wall with feet further from wall (18")x 10 reps Endurance exercises with UE's at 90 degrees abduction x 10 reps each Lower trapezius exercise off wall with lat stretch following (10 reps each UE then 10 reps bilateral)  Patient response to treatment: Patient demonstrated good technique with all exercises with minimal to no cuing for correct alignment of shoulder. She demonstrated improved soft tissue elasticity and flexibility in cervical spine/right shoulder by at least 50% following STM.          PT Education - 10/28/16 1043    Education provided Yes   Education Details re assessed exercises for stretching, lower trapezius strengthening exercises   Person(s) Educated Patient   Methods Explanation;Demonstration;Verbal cues   Comprehension Verbalized understanding;Returned demonstration;Verbal cues required             PT Long Term Goals - 10/28/16 1130      PT LONG TERM GOAL #1   Title Patient will demonstrate improved function with right UE for daily tasks as indicated by QuickDash score of 20% or less by 11/02/2016   Baseline initially 77%; crrent 09/06/16 40%; 10/04/16 = 39%; 10/28/16 9%   Status Achieved     PT LONG TERM GOAL #2   Title Patient will demonstrate full pain free AROM for overhead and behind back with right UE use for personal care and work related activities by 11/02/2016   Baseline initially: unable to raise right UE without pain limiting motion, passive ROM <90 flexion; current 10/04/16 flexion 165, behind back to L4-5 with effort;  10/28/16 flexion 170, IR behind back L3 without difficulty   Status Achieved     PT LONG TERM GOAL #3   Title Patient will be independent with home program  for pain control, exercise for right UE to allow self management once discharged from physical therapy by 11/02/2016   Baseline limited knowledge of appropriate exercises, pain control strategies and progression, requires guidance and assistance; 10/28/16 independent with self management/home program   Status Achieved               Plan - 10/28/16 1124    Clinical Impression Statement Patient has achieved all goals and is ready to discharge from physical therapy with indendenet home program. She has a quickDash score of 9% and work module of 6.25% (0 = no self perceived disability) She reports she is 90% improved with right shoulder and functional use from when she began physical therapy. No further therapy is recommended at this time. plan: discharge to home program/self managment.    Rehab Potential Good   PT Frequency 2x / week   PT Duration 4 weeks   PT Treatment/Interventions Electrical Stimulation;Cryotherapy;Moist Heat;Ultrasound;Patient/family education;Neuromuscular re-education;Therapeutic exercise;Manual techniques;Passive range of motion;Dry needling;Iontophoresis 4mg /ml Dexamethasone   PT Next Visit Plan discharge from physical therapy at this time   PT Home Exercise Plan posture correction, scapular retraction with use of resistive bands, use of heat/ice; pulleys for ROM; stabilization supine lying, side lying  ER, standing scaption, abduction and ER at wall      Patient will benefit from skilled therapeutic intervention in order to improve the following deficits and impairments:  Decreased strength, Impaired flexibility, Pain, Impaired perceived functional ability, Decreased activity tolerance, Increased muscle spasms, Impaired UE functional use, Decreased range of motion  Visit Diagnosis: Muscle weakness (generalized)  Other muscle spasm     Problem List There are no active problems to display for this patient.   Beacher May PT 10/29/2016, 12:28 PM  [facility] [address], [place], [address] Phone: [phone]   Fax:  [phone]  Name: Orly Quimby MRN: [id number] Date of Birth: 1965-07-26

## 2017-01-10 ENCOUNTER — Encounter: Payer: Self-pay | Admitting: Physician Assistant

## 2017-01-10 ENCOUNTER — Ambulatory Visit: Payer: Self-pay | Admitting: Physician Assistant

## 2017-01-10 VITALS — BP 120/70 | HR 90 | Temp 98.5°F | Resp 16

## 2017-01-10 DIAGNOSIS — H0520 Unspecified exophthalmos: Secondary | ICD-10-CM

## 2017-01-10 NOTE — Progress Notes (Signed)
S: pt states she had a few altered thyroid tests, her doctor told her she did not really need medicine, now she is worried because she feels like her eye is bulging out, will have watery drainage at times, some weight gain but will then have weight loss, seems to be tired a little more than usual. does not have a pcp at this time,   O: vitals wnl, nad, perrl eomi, eyes appear normal, periorbital tissue below left eye is a little swollen, tms clear, neck supple no lymph, lungs c t a,cv rrr, cn II-XII grossly intact  A: sensation of bulging eyes  P: will refer to a pcp, pt wants to wait and get lab work with her regular doctor due to pricing at hospital

## 2017-01-17 ENCOUNTER — Encounter: Payer: Self-pay | Admitting: Physician Assistant

## 2017-01-17 ENCOUNTER — Ambulatory Visit: Payer: Self-pay | Admitting: Physician Assistant

## 2017-01-17 VITALS — BP 130/90 | HR 83 | Temp 99.0°F

## 2017-01-17 DIAGNOSIS — R6889 Other general symptoms and signs: Principal | ICD-10-CM

## 2017-01-17 DIAGNOSIS — G514 Facial myokymia: Secondary | ICD-10-CM

## 2017-01-17 DIAGNOSIS — R448 Other symptoms and signs involving general sensations and perceptions: Secondary | ICD-10-CM

## 2017-01-17 NOTE — Progress Notes (Signed)
S: c/o left eye twitching, face still feels tight, now left arm and shoulder a tight, pain into left hand, thinks its from the face feeling tight, no fever/chills, no cp/sob, no v/d,  O: vitals wnl, nad, perrl eomi, tms clear, nasal mucosa wnl, throat wnl, neck supple no lymph, lungs c t a, cv rrr, left shoulder has muscle spasm in left trapezious, full rom, grips = b/l, n/v intact  A: facial twitching and pressure  P: f/u with pcp, we saw her last week and told her to f/u with a pcp, made an appointment, offered to send her to ENT but pt doesn't want to see them

## 2017-02-14 ENCOUNTER — Encounter: Payer: Self-pay | Admitting: Family

## 2017-02-14 ENCOUNTER — Ambulatory Visit (INDEPENDENT_AMBULATORY_CARE_PROVIDER_SITE_OTHER): Payer: 59 | Admitting: Family

## 2017-02-14 VITALS — BP 150/88 | HR 75 | Temp 99.0°F | Resp 14 | Ht 62.99 in | Wt 175.6 lb

## 2017-02-14 DIAGNOSIS — I10 Essential (primary) hypertension: Secondary | ICD-10-CM | POA: Diagnosis not present

## 2017-02-14 DIAGNOSIS — H578 Other specified disorders of eye and adnexa: Secondary | ICD-10-CM

## 2017-02-14 DIAGNOSIS — Z1231 Encounter for screening mammogram for malignant neoplasm of breast: Secondary | ICD-10-CM | POA: Diagnosis not present

## 2017-02-14 DIAGNOSIS — H5789 Other specified disorders of eye and adnexa: Secondary | ICD-10-CM

## 2017-02-14 DIAGNOSIS — Z1239 Encounter for other screening for malignant neoplasm of breast: Secondary | ICD-10-CM

## 2017-02-14 LAB — COMPREHENSIVE METABOLIC PANEL
ALBUMIN: 3.9 g/dL (ref 3.5–5.2)
ALK PHOS: 106 U/L (ref 39–117)
ALT: 22 U/L (ref 0–35)
AST: 21 U/L (ref 0–37)
BUN: 10 mg/dL (ref 6–23)
CALCIUM: 9.2 mg/dL (ref 8.4–10.5)
CO2: 29 mEq/L (ref 19–32)
CREATININE: 0.75 mg/dL (ref 0.40–1.20)
Chloride: 104 mEq/L (ref 96–112)
GFR: 86.45 mL/min (ref >60.00)
Glucose, Bld: 112 mg/dL — ABNORMAL HIGH (ref 70–99)
POTASSIUM: 4.3 meq/L (ref 3.5–5.1)
SODIUM: 139 meq/L (ref 135–145)
TOTAL PROTEIN: 7.3 g/dL (ref 6.0–8.3)
Total Bilirubin: 0.3 mg/dL (ref 0.2–1.2)

## 2017-02-14 LAB — CBC WITH DIFFERENTIAL/PLATELET
BASOS ABS: 0.1 10*3/uL (ref 0.0–0.1)
Basophils Relative: 0.7 % (ref 0.0–3.0)
EOS ABS: 0.2 10*3/uL (ref 0.0–0.7)
Eosinophils Relative: 2.2 % (ref 0.0–5.0)
HCT: 39.1 % (ref 36.0–46.0)
Hemoglobin: 12.9 g/dL (ref 12.0–15.0)
LYMPHS ABS: 2.4 10*3/uL (ref 0.7–4.0)
Lymphocytes Relative: 30.4 % (ref 12.0–46.0)
MCHC: 32.9 g/dL (ref 30.0–36.0)
MCV: 89.3 fl (ref 78.0–100.0)
Monocytes Absolute: 0.5 10*3/uL (ref 0.1–1.0)
Monocytes Relative: 5.9 % (ref 3.0–12.0)
NEUTROS ABS: 4.9 10*3/uL (ref 1.4–7.7)
Neutrophils Relative %: 60.8 % (ref 43.0–77.0)
PLATELETS: 203 10*3/uL (ref 150.0–400.0)
RBC: 4.38 Mil/uL (ref 3.87–5.11)
RDW: 14 % (ref 11.5–15.5)
WBC: 8 10*3/uL (ref 4.0–10.5)

## 2017-02-14 LAB — SEDIMENTATION RATE: SED RATE: 17 mm/h (ref 0–30)

## 2017-02-14 LAB — C-REACTIVE PROTEIN: CRP: 0.6 mg/dL (ref 0.5–20.0)

## 2017-02-14 NOTE — Progress Notes (Signed)
Subjective:    Patient ID: Rachel Hinton, female    DOB: [birth year], 51 y.o.   MRN: [id number]  CC: [name] is a 51 y.o. female who presents today for an acute visit.    HPI: CC: left eye 'sore' x 8 months ago, waxing and waning.  Describes as the 'whole eye is sore.'   Endorses HA, rates 2/10, describes 'not pain'.  Describes  Left eye twitching, watery -clear after 15 minutes of reading/writing. Not wearing glasses.   No eye itching.   No fever, vision changes, numbness/tingling in face, fever, sinus congestion, eye redness, foreign body sensation.   Hasn't tried any medication.   Has been seen by employee health.  Concerned for thyroid disease as told years ago.   Had preeclampsia when pregnant; never been treated for HTN. Denies exertional chest pain or pressure, numbness or tingling radiating to left arm or jaw, palpitations, dizziness, frequent headaches, changes in vision, or shortness of breath.    Due for mammogram     HISTORY:  Past Medical History:  Diagnosis Date  . 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 CESAREAN SECTION    . SHOULDER ARTHROSCOPY WITH DISTAL CLAVICLE RESECTION Right 07/19/2016   Procedure: SHOULDER ARTHROSCOPY, DEBRIDEMENT PARTIAL ROTATOR CUFF TEAR, SUBACROMIAL DECOMPRESSION, DISTAL CLAVICAL EXCISION;  Surgeon: Jones Broom, MD;  Location: [facility];  Service: Orthopedics;  Laterality: Right;  . SUBACROMIAL DECOMPRESSION Right 07/19/2016   Procedure: SUBACROMIAL DECOMPRESSION;  Surgeon: Jones Broom, MD;  Location: [facility];  Service: Orthopedics;  Laterality: Right;   Family History  Problem Relation Age of Onset  . Hypertension Mother     Allergies: Acyclovir and related No current outpatient prescriptions on file prior to visit.   No current facility-administered medications on file prior to visit.     Social History    Substance Use Topics  . Smoking status: Never Smoker  . Smokeless tobacco: Never Used  . Alcohol use No    Review of Systems  Constitutional: Negative for chills and fever.  HENT: Negative for congestion, ear discharge, ear pain and sore throat.   Eyes: Positive for pain and discharge. Negative for photophobia, redness, itching and visual disturbance.  Respiratory: Negative for cough.   Cardiovascular: Negative for chest pain and palpitations.  Gastrointestinal: Negative for nausea and vomiting.  Neurological: Positive for headaches.      Objective:    BP (!) 150/88 (BP Location: Right Arm, Patient Position: Sitting, Cuff Size: Normal)   Pulse 75   Temp 99 F (37.2 C) (Oral)   Resp 14   Ht 5' 2.99" (1.6 m)   Wt 175 lb 9.6 oz (79.7 kg)   LMP 07/13/2014   SpO2 97%   BMI 31.11 kg/m    Physical Exam  Constitutional: She appears well-developed and well-nourished.  HENT:  Head: Normocephalic and atraumatic.  Right Ear: Hearing, tympanic membrane, external ear and ear canal normal. No drainage, swelling or tenderness. No foreign bodies. Tympanic membrane is not erythematous and not bulging. No middle ear effusion. No decreased hearing is noted.  Left Ear: Hearing, tympanic membrane, external ear and ear canal normal. No drainage, swelling or tenderness. No foreign bodies. Tympanic membrane is not erythematous and not bulging.  No middle ear effusion. No decreased hearing is noted.  Nose: Nose normal. No rhinorrhea. Right sinus exhibits no maxillary sinus tenderness and no frontal sinus tenderness. Left sinus exhibits  no maxillary sinus tenderness and no frontal sinus tenderness.  Mouth/Throat: Uvula is midline, oropharynx is clear and moist and mucous membranes are normal. No oropharyngeal exudate, posterior oropharyngeal edema, posterior oropharyngeal erythema or tonsillar abscesses.  No scalp or temporal tenderness  Eyes: Pupils are equal, round, and reactive to light.  Conjunctivae, EOM and lids are normal. Lids are everted and swept, no foreign bodies found. Right eye exhibits no discharge. Left eye exhibits no discharge and no hordeolum. Right conjunctiva is not injected. Right conjunctiva has no hemorrhage. Left conjunctiva is not injected. Left conjunctiva has no hemorrhage. No scleral icterus.  Normal fundus bilaterally   Cardiovascular: Normal rate, regular rhythm, normal heart sounds and normal pulses.   Pulmonary/Chest: Effort normal and breath sounds normal. She has no wheezes. She has no rhonchi. She has no rales.  Lymphadenopathy:       Head (right side): No submental, no submandibular, no tonsillar, no preauricular, no posterior auricular and no occipital adenopathy present.       Head (left side): No submental, no submandibular, no tonsillar, no preauricular, no posterior auricular and no occipital adenopathy present.    She has no cervical adenopathy.       Right cervical: No superficial cervical, no deep cervical and no posterior cervical adenopathy present.      Left cervical: No superficial cervical, no deep cervical and no posterior cervical adenopathy present.  Neurological: She is alert. She has normal strength. No cranial nerve deficit or sensory deficit. She displays a negative Romberg sign.  Reflex Scores:      Bicep reflexes are 2+ on the right side and 2+ on the left side.      Patellar reflexes are 2+ on the right side and 2+ on the left side. Grip equal and strong bilateral upper extremities. Gait strong and steady. Able to perform  finger-to-nose without difficulty.   Skin: Skin is warm and dry.  Psychiatric: She has a normal mood and affect. Her speech is normal and behavior is normal. Thought content normal.  Vitals reviewed.      Assessment & Plan:   Problem List Items Addressed This Visit      Cardiovascular and Mediastinum   Hypertension     Other   Eye discharge - Primary     Etiology of symptom is nonspecific at this  time. Working diagnosis includes eye  fatigue as clear runny discharge seems to correspond with reading, writing per patient. Reassured by normal neurologic and eye exam today.  discussed with patent since she's had a mild headache, and no real history of headaches the past, I'd like to pursue MRI brain however I think is reasonable for her to see ophthalmology first To see if complaint is related to need for vision correction. Advised patient to return after ophthalmologist appointment,and we will discuss whether imaging is an appropriate next step. Return precautions given      Relevant Orders   Ambulatory referral to Ophthalmology   CBC with Differential/Platelet   Comprehensive metabolic panel   C-reactive protein   Sedimentation rate   Screening for breast cancer    Ordered and scheduled for patient. Patient will follow-up with physical exam after mammogram.      Relevant Orders   MM SCREENING BREAST TOMO BILATERAL        I have discontinued Ms. Kirsten's oxyCODONE-acetaminophen, docusate sodium, and naproxen.   No orders of the defined types were placed in this encounter.   Return precautions given.  Risks, benefits, and alternatives of the medications and treatment plan prescribed today were discussed, and patient expressed understanding.   Education regarding symptom management and diagnosis given to patient on AVS.  Continue to follow with Allegra Grana, FNP for routine health maintenance.   Rachel Hinton and I agreed with plan.   Rennie Plowman, FNP

## 2017-02-14 NOTE — Patient Instructions (Addendum)
You need to see an eye doctor for eye exam as suspect your symptom is related to eye fatigue  Labs today  I have placed a referral  Please make an appointment after you have seen eye doctor so we can decide if you need imaging.   Please return for routine physical. Mammogram scheduled

## 2017-02-14 NOTE — Assessment & Plan Note (Signed)
Ordered and scheduled for patient. Patient will follow-up with physical exam after mammogram.

## 2017-02-14 NOTE — Assessment & Plan Note (Addendum)
Etiology of symptom is nonspecific at this time. Working diagnosis includes eye  fatigue as clear runny discharge seems to correspond with reading, writing per patient. Reassured by normal neurologic and eye exam today.  discussed with patent since she's had a mild headache, and no real history of headaches the past, I'd like to pursue MRI brain however I think is reasonable for her to see ophthalmology first To see if complaint is related to need for vision correction. Advised patient to return after ophthalmologist appointment,and we will discuss whether imaging is an appropriate next step. Return precautions given

## 2017-02-14 NOTE — Progress Notes (Signed)
Patient called she works at [employer] she will have them check for the next few days and give [place] a call to make appointment if it is over 130/80

## 2017-02-14 NOTE — Progress Notes (Signed)
Pre-visit discussion using our clinic review tool. No additional management support is needed unless otherwise documented below in the visit note.  

## 2017-02-16 ENCOUNTER — Other Ambulatory Visit: Payer: Self-pay | Admitting: Family

## 2017-02-16 ENCOUNTER — Telehealth: Payer: Self-pay

## 2017-02-16 DIAGNOSIS — I10 Essential (primary) hypertension: Secondary | ICD-10-CM

## 2017-02-16 MED ORDER — AMLODIPINE BESYLATE 2.5 MG PO TABS: 2.5000 mg | ORAL_TABLET | Freq: Every day | ORAL | 3 refills | Status: DC

## 2017-02-16 NOTE — Progress Notes (Signed)
Discussed with tanya when patient walked in today  BP running 160's  Patient will schedule f/u with me

## 2017-02-16 NOTE — Telephone Encounter (Signed)
Agree with evaluation by tanya and plan  Rennie Plowman NP

## 2017-02-16 NOTE — Telephone Encounter (Signed)
Patient came to the office, complaints of HTN.  Patient was seen on 8/20 has increased BP in the 150's asystolically.  Patient has issues with a abscessed tooth currently has plans to have it fixed on 8/29 at 845 am.  Patient denies, shortness of breath, no chest pain, no headaches.  Patient had a co worker check her BP yesterday and it was 159/85 at work.  Was not able to have someone check it today so she wants it checked.  Checked in bilateral upper extremities.  160/90 in both upper arms.  Pulse 70, PO2 98%.   Spoke with PCP and advised that she needs to start Amlodipine 2.5mg  (Low dose) and then schedule follow up with PCP next week after dental work.  Patient agreed with the plan and scheduled follow up next week with PCP.  Agreed to pick up Rx and check BP daily for the next few days. Thanks

## 2017-02-17 ENCOUNTER — Encounter: Payer: Self-pay | Admitting: Family

## 2017-02-23 ENCOUNTER — Encounter: Payer: Self-pay | Admitting: Family

## 2017-02-23 ENCOUNTER — Ambulatory Visit (INDEPENDENT_AMBULATORY_CARE_PROVIDER_SITE_OTHER): Payer: 59 | Admitting: Family

## 2017-02-23 DIAGNOSIS — I10 Essential (primary) hypertension: Secondary | ICD-10-CM | POA: Diagnosis not present

## 2017-02-23 NOTE — Progress Notes (Signed)
Subjective:    Patient ID: Rachel Hinton, female    DOB: 1965/10/13, 51 y.o.   MRN: [id number]  CC: Rachel Hinton is a 51 y.o. female who presents today for follow up.   HPI: HTN- has improved since starting amlodipine Values at home-138/90, 129/85.  Denies exertional chest pain or pressure, numbness or tingling radiating to left arm or jaw, palpitations, dizziness, frequent headaches, changes in vision, or shortness of breath.   Continues to follow with dentist for abscess with pain improving.      HISTORY:  Past Medical History:  Diagnosis Date  . 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 CESAREAN SECTION    . SHOULDER ARTHROSCOPY WITH DISTAL CLAVICLE RESECTION Right 07/19/2016   Procedure: SHOULDER ARTHROSCOPY, DEBRIDEMENT PARTIAL ROTATOR CUFF TEAR, SUBACROMIAL DECOMPRESSION, DISTAL CLAVICAL EXCISION;  Surgeon: Rachel BroomJustin Chandler, MD;  Location: [facility];  Service: Orthopedics;  Laterality: Right;  . SUBACROMIAL DECOMPRESSION Right 07/19/2016   Procedure: SUBACROMIAL DECOMPRESSION;  Surgeon: Rachel BroomJustin Chandler, MD;  Location: [facility];  Service: Orthopedics;  Laterality: Right;   Family History  Problem Relation Age of Onset  . Hypertension Mother     Allergies: Acyclovir and related Current Outpatient Prescriptions on File Prior to Visit  Medication Sig Dispense Refill  . amLODipine (NORVASC) 2.5 MG tablet Take 1 tablet (2.5 mg total) by mouth daily. 90 tablet 3   No current facility-administered medications on file prior to visit.     Social History  Substance Use Topics  . Smoking status: Never Smoker  . Smokeless tobacco: Never Used  . Alcohol use No    Review of Systems  Constitutional: Negative for chills and fever.  Respiratory: Negative for cough.   Cardiovascular: Negative for chest pain and palpitations.  Gastrointestinal: Negative for nausea and vomiting.        Objective:    BP 138/90   Pulse 78   Temp 98.6 F (37 C) (Oral)   Ht 5\' 3"  (1.6 m)   Wt 174 lb (78.9 kg)   LMP 07/13/2014   SpO2 98%   BMI 30.82 kg/m  BP Readings from Last 3 Encounters:  02/23/17 138/90  02/14/17 (!) 150/88  01/17/17 130/90   Wt Readings from Last 3 Encounters:  02/23/17 174 lb (78.9 kg)  02/14/17 175 lb 9.6 oz (79.7 kg)  07/19/16 168 lb (76.2 kg)    Physical Exam  Constitutional: She appears well-developed and well-nourished.  Eyes: Conjunctivae are normal.  Cardiovascular: Normal rate, regular rhythm, normal heart sounds and normal pulses.   Pulmonary/Chest: Effort normal and breath sounds normal. She has no wheezes. She has no rhonchi. She has no rales.  Neurological: She is alert.  Skin: Skin is warm and dry.  Psychiatric: She has a normal mood and affect. Her speech is normal and behavior is normal. Thought content normal.  Vitals reviewed.      Assessment & Plan:   Problem List Items Addressed This Visit      Cardiovascular and Mediastinum   Hypertension    Slightly elevated today. This may be in the context of dental pain however went ahead and decided to increase amlodipine to 5 mg and have patient closely monitor. Follow-up in one month          I am having Rachel Hinton maintain her amLODipine.   No orders of the defined types were placed in this encounter.   Return precautions  given.   Risks, benefits, and alternatives of the medications and treatment plan prescribed today were discussed, and patient expressed understanding.   Education regarding symptom management and diagnosis given to patient on AVS.  Continue to follow with Rachel Grana, FNP for routine health maintenance.   Rachel Hinton and I agreed with plan.   Rachel Plowman, FNP

## 2017-02-23 NOTE — Assessment & Plan Note (Signed)
Slightly elevated today. This may be in the context of dental pain however went ahead and decided to increase amlodipine to 5 mg and have patient closely monitor. Follow-up in one month

## 2017-02-23 NOTE — Progress Notes (Signed)
Pre visit review using our clinic review tool, if applicable. No additional management support is needed unless otherwise documented below in the visit note. 

## 2017-02-23 NOTE — Patient Instructions (Addendum)
Goal of blood pressure is less than 130/80  Increase amlodipine to 5mg  ( so take two pills).   Follow up one month, sooner if needed   Heart Attack A heart attack (myocardial infarction, MI) causes damage to the heart that cannot be fixed. A heart attack often happens when a blood clot or other blockage cuts blood flow to the heart. When this happens, certain areas of the heart begin to die. This causes the pain you feel during a heart attack. Follow these instructions at home:  Take medicine as told by your doctor. You may need medicine to: ? Keep your blood from clotting too easily. ? Control your blood pressure. ? Lower your cholesterol. ? Control abnormal heart rhythms.  Change certain behaviors as told by your doctor. This may include: ? Quitting smoking. ? Being active. ? Eating a heart-healthy diet. Ask your doctor for help with this diet. ? Keeping a healthy weight. ? Keeping your diabetes under control. ? Lessening stress. ? Limiting how much alcohol you drink. Do not take these medicines unless your doctor says that you can:  Nonsteroidal anti-inflammatory drugs (NSAIDs). These include: ? Ibuprofen. ? Naproxen. ? Celecoxib.  Vitamin supplements that have vitamin A, vitamin E, or both.  Hormone therapy that contains estrogen with or without progestin.  Get help right away if:  You have sudden chest discomfort.  You have sudden discomfort in your: ? Arms. ? Back. ? Neck. ? Jaw.  You have shortness of breath at any time.  You have sudden sweating or clammy skin.  You feel sick to your stomach (nauseous) or throw up (vomit).  You suddenly get light-headed or dizzy.  You feel your heart beating fast or skipping beats. These symptoms may be an emergency. Do not wait to see if the symptoms will go away. Get medical help right away. Call your local emergency services (911 in the [place] Do not drive yourself to the hospital. This information is not intended to  replace advice given to you by your health care provider. Make sure you discuss any questions you have with your health care provider. Document Released: 12/14/2011 Document Revised: 11/20/2015 Document Reviewed: 08/17/2013 Elsevier Interactive Patient Education  2017 [employer]

## 2017-02-25 ENCOUNTER — Encounter: Payer: 59 | Attending: Family | Admitting: Dietician

## 2017-02-25 ENCOUNTER — Encounter: Payer: Self-pay | Admitting: Dietician

## 2017-02-25 VITALS — Ht 63.0 in | Wt 176.2 lb

## 2017-02-25 DIAGNOSIS — Z713 Dietary counseling and surveillance: Secondary | ICD-10-CM | POA: Insufficient documentation

## 2017-02-25 NOTE — Progress Notes (Signed)
Notes from [facility] employee "self referral" nutrition session: Start time: 13:15   End time: 14:00  Met with employee to discuss his/her nutritional concerns and diet history. The employee's questions/concerns were also addressed. She would like to learn how to eat right and prepare nutritious meals for not only herself but for her son. States the Dr recommended her son lose weight / become more active. She denies any h/o dieting. Does not eat out often and does not cook with salt. Typical cooking methods at home include baking/ roasting, grilling or steaming. Consumes whole grain breads and rice.  We discussed the following topics:  Healthy Eating  Exercise  Hypertension  Planning a balanced meal  Nutrition requirements for her 69 y/o son  Reading a nutrition facts label   Smart snacking  Sugar-sweetened beverages  I also provided the following handouts as reinforcement of the educational session:  Reading a nutrition facts label  Nutrition guidelines for 8-12 y/o  Sample menus and/or recipes for school lunch   Additional Comments:  Typical day of eating: Bkfst: boiled or fried eggs, pancakes, cereal (cinnamon toast crunch), breakfast scramble with bacon, cheese and peppers Snk: green tea + chips, popcorn, cookie, crackers Lnch: tuna sandwich, [place] + cheese sandwich with chips or crackers, occasionally raw veggies Din: bake meat (family consumes both white and red meat), steam vegetables sometimes with cheese sauce, potatoes Snk: popcorn, fruit Beverages: pepsi, green tea, coffee   Goals Agreed Upon: 1. Start to reduce the amount of sodas you consume, and the amount of juices your son consumes. Find lower/no calorie substitutes for these items.  2. Practice building balanced meals, including at least two food groups at each meal and snack, to help meet daily nutrition requirements.  3. Experiment with different, more whole-food based, snack options that you can take to  work or that you can give to your son.

## 2017-03-03 ENCOUNTER — Ambulatory Visit
Admission: RE | Admit: 2017-03-03 | Discharge: 2017-03-03 | Disposition: A | Discharge status: Home / Self Care | Payer: 59 | Source: Ambulatory Visit | Attending: Family | Admitting: Family

## 2017-03-03 DIAGNOSIS — Z1231 Encounter for screening mammogram for malignant neoplasm of breast: Secondary | ICD-10-CM | POA: Insufficient documentation

## 2017-03-03 DIAGNOSIS — Z1239 Encounter for other screening for malignant neoplasm of breast: Secondary | ICD-10-CM

## 2017-03-04 ENCOUNTER — Other Ambulatory Visit: Payer: Self-pay | Admitting: Family

## 2017-03-04 ENCOUNTER — Other Ambulatory Visit: Payer: Self-pay | Admitting: Ophthalmology

## 2017-03-04 DIAGNOSIS — H0520 Unspecified exophthalmos: Secondary | ICD-10-CM

## 2017-03-04 DIAGNOSIS — R928 Other abnormal and inconclusive findings on diagnostic imaging of breast: Secondary | ICD-10-CM

## 2017-03-04 DIAGNOSIS — N6489 Other specified disorders of breast: Secondary | ICD-10-CM

## 2017-03-08 ENCOUNTER — Ambulatory Visit
Admission: RE | Admit: 2017-03-08 | Discharge: 2017-03-08 | Disposition: A | Discharge status: Home / Self Care | Payer: 59 | Source: Ambulatory Visit | Attending: Ophthalmology | Admitting: Ophthalmology

## 2017-03-08 DIAGNOSIS — R220 Localized swelling, mass and lump, head: Secondary | ICD-10-CM | POA: Diagnosis not present

## 2017-03-08 DIAGNOSIS — H0520 Unspecified exophthalmos: Secondary | ICD-10-CM | POA: Diagnosis not present

## 2017-03-08 MED ORDER — GADOBENATE DIMEGLUMINE 529 MG/ML IV SOLN
15.0000 mL | Freq: Once | INTRAVENOUS | Status: AC | PRN
  Administered 2017-03-08: 15 mL via INTRAVENOUS

## 2017-03-24 ENCOUNTER — Ambulatory Visit
Admission: RE | Admit: 2017-03-24 | Discharge: 2017-03-24 | Disposition: A | Discharge status: Home / Self Care | Payer: 59 | Source: Ambulatory Visit | Attending: Family | Admitting: Family

## 2017-03-24 DIAGNOSIS — N6489 Other specified disorders of breast: Secondary | ICD-10-CM

## 2017-03-24 DIAGNOSIS — R928 Other abnormal and inconclusive findings on diagnostic imaging of breast: Secondary | ICD-10-CM | POA: Diagnosis not present

## 2017-03-24 DIAGNOSIS — R922 Inconclusive mammogram: Secondary | ICD-10-CM | POA: Diagnosis not present

## 2017-03-29 ENCOUNTER — Encounter: Payer: Self-pay | Admitting: Family

## 2017-03-29 ENCOUNTER — Ambulatory Visit (INDEPENDENT_AMBULATORY_CARE_PROVIDER_SITE_OTHER): Payer: 59 | Admitting: Family

## 2017-03-29 VITALS — BP 136/74 | HR 74 | Temp 98.4°F | Ht 63.0 in | Wt 173.8 lb

## 2017-03-29 DIAGNOSIS — I10 Essential (primary) hypertension: Secondary | ICD-10-CM

## 2017-03-29 DIAGNOSIS — H5789 Other specified disorders of eye and adnexa: Secondary | ICD-10-CM

## 2017-03-29 DIAGNOSIS — K0889 Other specified disorders of teeth and supporting structures: Secondary | ICD-10-CM | POA: Diagnosis not present

## 2017-03-29 DIAGNOSIS — R51 Headache: Secondary | ICD-10-CM | POA: Diagnosis not present

## 2017-03-29 DIAGNOSIS — R519 Headache, unspecified: Secondary | ICD-10-CM

## 2017-03-29 NOTE — Assessment & Plan Note (Signed)
No obvious infection however based on recent root canal and yesterday filling, advised to start amoxicillin as prescribed by dentist. Advised f/u one week

## 2017-03-29 NOTE — Progress Notes (Addendum)
Subjective:    Patient ID: Rachel Hinton, female    DOB: Sep 22, 1965, 51 y.o.   MRN: [id number]  CC: Rachel Hinton is a 51 y.o. female who presents today for follow up.   HPI: HTN- increased amlodipine to   Denies exertional chest pain or pressure, numbness or tingling radiating to left arm or jaw, palpitations, dizziness, frequent headaches, changes in vision, or shortness of breath.   Abscess resolved after root canal x 3 weeks ago. Never took antibiotics. Dentists had given her amoxicillin however 'didn't think I needed it.' However  in the past 2 days, mild pain in left side of cheek.No fever, N, V, foul taste ; saw dentist yesterday for follow up and filling on left side ( for eventual crown in 06/2017) and advised to start antibiotic. She wanted to wait until she was seen by me today.   Has seen opthalmology and 'nothing wrong with eye.' Still having of left eye fatigue, headache, and left facial numbness intermittently. Reports continued left eye clear discharge in morning. No eye pain.        MR Orbits - no intraorbital mass.  HISTORY:  Past Medical History:  Diagnosis Date  . 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 CESAREAN SECTION    . SHOULDER ARTHROSCOPY WITH DISTAL CLAVICLE RESECTION Right 07/19/2016   Procedure: SHOULDER ARTHROSCOPY, DEBRIDEMENT PARTIAL ROTATOR CUFF TEAR, SUBACROMIAL DECOMPRESSION, DISTAL CLAVICAL EXCISION;  Surgeon: Jones Broom, MD;  Location: [facility];  Service: Orthopedics;  Laterality: Right;  . SUBACROMIAL DECOMPRESSION Right 07/19/2016   Procedure: SUBACROMIAL DECOMPRESSION;  Surgeon: Jones Broom, MD;  Location: [facility];  Service: Orthopedics;  Laterality: Right;   Family History  Problem Relation Age of Onset  . Hypertension Mother     Allergies: Acyclovir and related Current Outpatient Prescriptions on File Prior to Visit    Medication Sig Dispense Refill  . amLODipine (NORVASC) 2.5 MG tablet Take 1 tablet (2.5 mg total) by mouth daily. 90 tablet 3   No current facility-administered medications on file prior to visit.     Social History  Substance Use Topics  . Smoking status: Never Smoker  . Smokeless tobacco: Never Used  . Alcohol use No    Review of Systems  Constitutional: Negative for chills and fever.  HENT: Negative for congestion.   Eyes: Positive for discharge (clear runny). Negative for photophobia, redness, itching and visual disturbance.  Respiratory: Negative for cough.   Cardiovascular: Negative for chest pain and palpitations.  Gastrointestinal: Negative for nausea and vomiting.  Neurological: Positive for headaches. Negative for dizziness.      Objective:    BP 136/74   Pulse 74   Temp 98.4 F (36.9 C) (Oral)   Ht  (1.6 m)   Wt 173 lb 12.8 oz (78.8 kg)   LMP 07/13/2014   SpO2 98%   BMI 30.79 kg/m  BP Readings from Last 3 Encounters:  03/29/17 136/74  02/23/17 138/90  02/14/17 (!) 150/88   Wt Readings from Last 3 Encounters:  03/29/17 173 lb 12.8 oz (78.8 kg)  02/25/17 176 lb 3.2 oz (79.9 kg)  02/23/17 174 lb (78.9 kg)    Physical Exam  Constitutional: She appears well-developed and well-nourished.  HENT:  Head:    Mouth/Throat: Uvula is midline, oropharynx is clear and moist and mucous membranes are normal. She does not have dentures. No oral lesions. No trismus in the  jaw. No dental abscesses or uvula swelling. No posterior oropharyngeal erythema.  No oral lesions, abscess seen. Slight tenderness left side of cheek as marked on diagram.   Eyes: Pupils are equal, round, and reactive to light. Conjunctivae and EOM are normal.  Fundus normal bilaterally.   Cardiovascular: Normal rate, regular rhythm, normal heart sounds and normal pulses.   Pulmonary/Chest: Effort normal and breath sounds normal. She has no wheezes. She has no rhonchi. She has no rales.   Neurological: She is alert. She has normal strength. No cranial nerve deficit or sensory deficit. She displays a negative Romberg sign.  Reflex Scores:      Bicep reflexes are 2+ on the right side and 2+ on the left side.      Patellar reflexes are 2+ on the right side and 2+ on the left side. Grip equal and strong bilateral upper extremities. Gait strong and steady. Able to perform rapid alternating movement without difficulty.   Skin: Skin is warm and dry.  Psychiatric: She has a normal mood and affect. Her speech is normal and behavior is normal. Thought content normal.  Vitals reviewed.      Assessment & Plan:   Problem List Items Addressed This Visit      Cardiovascular and Mediastinum   Hypertension - Primary    Improved. Continue current regimen        Other   Eye discharge    Complaint continues to be nonspecific. MR orbit normal and per patient eye exam was also normal.       Relevant Orders   MR MRA HEAD WO CONTRAST   Nonintractable headache    Reassured by normal neurologic exam today however concerned that patient headache has not improved with improved blood pressure.  Jointly agreed further imaging including MRA, MRI brain appropriate next step- ordered separately as unable to confirm whether MR MRA is both tests.        Relevant Orders   MR MRA HEAD WO CONTRAST   MR Brain W Wo Contrast   Pain, dental    No obvious infection however based on recent root canal and yesterday filling, advised to start amoxicillin as prescribed by dentist. Advised f/u one week           I am having Ms. Nobis maintain her amLODipine.   No orders of the defined types were placed in this encounter.   Return precautions given.   Risks, benefits, and alternatives of the medications and treatment plan prescribed today were discussed, and patient expressed understanding.   Education regarding symptom management and diagnosis given to patient on AVS.  Continue to follow  with Allegra Grana, FNP for routine health maintenance.   Rachel Hinton and I agreed with plan.   Rennie Plowman, FNP

## 2017-03-29 NOTE — Addendum Note (Signed)
Addended by: Allegra Grana on: 03/29/2017 12:14 PM   Modules accepted: Orders

## 2017-03-29 NOTE — Assessment & Plan Note (Signed)
Complaint continues to be nonspecific. MR orbit normal and per patient eye exam was also normal.

## 2017-03-29 NOTE — Patient Instructions (Addendum)
Start amoxicillin as prescribed by dentist  Ensure to take probiotics while on antibiotics and also for 2 weeks after completion. It is important to re-colonize the gut with good bacteria and also to prevent any diarrheal infections associated with antibiotic use.   Continue to monitor blood pressure. Goal less than 130/80  Please stay vigilant with symptoms and let me know if any new ones.  We have ordered MRI/ MRA of head.   We will call to schedule.

## 2017-03-29 NOTE — Assessment & Plan Note (Signed)
Improved.  Continue current regimen

## 2017-03-29 NOTE — Progress Notes (Signed)
Pre visit review using our clinic review tool, if applicable. No additional management support is needed unless otherwise documented below in the visit note. 

## 2017-03-29 NOTE — Assessment & Plan Note (Addendum)
Reassured by normal neurologic exam today however concerned that patient headache has not improved with improved blood pressure.  Jointly agreed further imaging including MRA, MRI brain appropriate next step- ordered separately as unable to confirm whether MR MRA is both tests.

## 2017-04-06 ENCOUNTER — Ambulatory Visit
Admission: RE | Admit: 2017-04-06 | Discharge: 2017-04-06 | Disposition: A | Discharge status: Home / Self Care | Payer: 59 | Source: Ambulatory Visit | Attending: Family | Admitting: Family

## 2017-04-06 DIAGNOSIS — R519 Headache, unspecified: Secondary | ICD-10-CM

## 2017-04-06 DIAGNOSIS — H5789 Other specified disorders of eye and adnexa: Secondary | ICD-10-CM | POA: Diagnosis not present

## 2017-04-06 DIAGNOSIS — R51 Headache: Secondary | ICD-10-CM | POA: Diagnosis not present

## 2017-04-06 MED ORDER — GADOBENATE DIMEGLUMINE 529 MG/ML IV SOLN
15.0000 mL | Freq: Once | INTRAVENOUS | Status: AC | PRN
  Administered 2017-04-06: 15 mL via INTRAVENOUS

## 2017-04-07 ENCOUNTER — Other Ambulatory Visit: Payer: Self-pay

## 2017-04-07 ENCOUNTER — Telehealth: Payer: Self-pay | Admitting: Family

## 2017-04-07 DIAGNOSIS — I10 Essential (primary) hypertension: Secondary | ICD-10-CM

## 2017-04-07 MED ORDER — AMLODIPINE BESYLATE 5 MG PO TABS: 5.0000 mg | ORAL_TABLET | Freq: Every day | ORAL | 1 refills | Status: DC

## 2017-04-07 NOTE — Telephone Encounter (Signed)
Medication has been refilled.

## 2017-04-07 NOTE — Telephone Encounter (Signed)
Pt needs a refill on amLODipine (NORVASC) 2.5 MG tablet, she was told to take 2 pills and now does not have enough pills to last for 30 days. Please advise.  Thanks.

## 2017-04-25 ENCOUNTER — Telehealth: Payer: Self-pay

## 2017-04-25 DIAGNOSIS — L259 Unspecified contact dermatitis, unspecified cause: Secondary | ICD-10-CM | POA: Diagnosis not present

## 2017-04-25 NOTE — Telephone Encounter (Signed)
Noted. Agree with advice.  

## 2017-04-25 NOTE — Telephone Encounter (Signed)
Patient walked into the office, requested a copy of her MRI results.  Researched in the chart and they were mailed but gave another copy to the patient.  Patient also complained of rash on bilateral ankles.  Triaged patient, bilateral ankles are red in nature, hot/warm to touch.  Patient states she woke up two days ago and her legs itched really bad, denies itching them.  No drainage from them, skin intact.  I advised patient that we had not appts and that she needed to be seen today.  [facility].  She agreed and thanks me.  FYI. thanks

## 2019-07-31 ENCOUNTER — Other Ambulatory Visit: Payer: Self-pay

## 2019-08-24 ENCOUNTER — Ambulatory Visit: Payer: Self-pay | Admitting: Family

## 2019-09-24 ENCOUNTER — Ambulatory Visit (INDEPENDENT_AMBULATORY_CARE_PROVIDER_SITE_OTHER): Payer: Self-pay | Admitting: Family

## 2019-09-24 ENCOUNTER — Other Ambulatory Visit: Payer: Self-pay

## 2019-09-24 ENCOUNTER — Encounter: Payer: Self-pay | Admitting: Family

## 2019-09-24 VITALS — BP 110/70 | HR 72 | Temp 98.0°F | Ht 63.0 in | Wt 170.8 lb

## 2019-09-24 DIAGNOSIS — Z1231 Encounter for screening mammogram for malignant neoplasm of breast: Secondary | ICD-10-CM

## 2019-09-24 DIAGNOSIS — I10 Essential (primary) hypertension: Secondary | ICD-10-CM

## 2019-09-24 DIAGNOSIS — Z1211 Encounter for screening for malignant neoplasm of colon: Secondary | ICD-10-CM

## 2019-09-24 DIAGNOSIS — R7989 Other specified abnormal findings of blood chemistry: Secondary | ICD-10-CM

## 2019-09-24 NOTE — Patient Instructions (Signed)
Blood pressure looks great  Please call call and schedule your 3D mammogram  as discussed.   [facility]  [address]  [place], [place]  [phone]  Let me know if you do not get a call for your colonoscopy appointment with GI.   Stay safe!

## 2019-09-24 NOTE — Assessment & Plan Note (Signed)
Symptoms resolved ( ha, palpitations). Well controlled without medication. Patient will continue to monitor.

## 2019-09-24 NOTE — Addendum Note (Signed)
Addended by: Warden Fillers on: 09/24/2019 04:41 PM   Modules accepted: Orders

## 2019-09-24 NOTE — Assessment & Plan Note (Signed)
Pending repeat thyroid studies.  Of note, patient will call and schedule mammogram

## 2019-09-24 NOTE — Progress Notes (Signed)
Subjective:    Patient ID: Rachel Hinton, female    DOB: 06/28/1966, 53 y.o.   MRN: [id number]  CC: Rachel Hinton is a 54 y.o. female who presents today for follow up.   HPI: Feels well today. HA, palpitations have resolved.  She is not on amlodipine.   130/67.   Thyroid has been abnormal.     Seen 07/31/19 in [facility] ED for elevated blood pressure, palpitations. BP 154/86 Troponin negative x 1; unable to see EKG at [facility] Had HA H/o htn  HISTORY:  Past Medical History:  Diagnosis Date  . 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 CESAREAN SECTION    . SHOULDER ARTHROSCOPY WITH DISTAL CLAVICLE RESECTION Right 07/19/2016   Procedure: SHOULDER ARTHROSCOPY, DEBRIDEMENT PARTIAL ROTATOR CUFF TEAR, SUBACROMIAL DECOMPRESSION, DISTAL CLAVICAL EXCISION;  Surgeon: Rachel Broom, MD;  Location: [facility];  Service: Orthopedics;  Laterality: Right;  . SUBACROMIAL DECOMPRESSION Right 07/19/2016   Procedure: SUBACROMIAL DECOMPRESSION;  Surgeon: Rachel Broom, MD;  Location: [facility];  Service: Orthopedics;  Laterality: Right;   Family History  Problem Relation Age of Onset  . Hypertension Mother     Allergies: Acyclovir and related Current Outpatient Medications on File Prior to Visit  Medication Sig Dispense Refill  . amLODipine (NORVASC) 5 MG tablet Take 1 tablet by mouth daily.     No current facility-administered medications on file prior to visit.    Social History   Tobacco Use  . Smoking status: Never Smoker  . Smokeless tobacco: Never Used  Substance Use Topics  . Alcohol use: No    Alcohol/week: 0.0 standard drinks  . Drug use: No    Review of Systems  Constitutional: Negative for chills and fever.  Respiratory: Negative for cough.   Cardiovascular: Negative for chest pain and palpitations.  Gastrointestinal: Negative for nausea and vomiting.  Neurological:  Negative for headaches (resolved).      Objective:    BP 110/70   Pulse 72   Temp 98 F (36.7 C)   Ht 5\' 3"  (1.6 m)   Wt 170 lb 12.8 oz (77.5 kg)   LMP 07/13/2014   SpO2 99%   BMI 30.26 kg/m  BP Readings from Last 3 Encounters:  09/24/19 110/70  03/29/17 136/74  02/23/17 138/90   Wt Readings from Last 3 Encounters:  09/24/19 170 lb 12.8 oz (77.5 kg)  03/29/17 173 lb 12.8 oz (78.8 kg)  02/25/17 176 lb 3.2 oz (79.9 kg)    Physical Exam Vitals reviewed.  Constitutional:      Appearance: She is well-developed.  Eyes:     Conjunctiva/sclera: Conjunctivae normal.  Cardiovascular:     Rate and Rhythm: Normal rate and regular rhythm.     Pulses: Normal pulses.     Heart sounds: Normal heart sounds.  Pulmonary:     Effort: Pulmonary effort is normal.     Breath sounds: Normal breath sounds. No wheezing, rhonchi or rales.  Skin:    General: Skin is warm and dry.  Neurological:     Mental Status: She is alert.  Psychiatric:        Speech: Speech normal.        Behavior: Behavior normal.        Thought Content: Thought content normal.        Assessment & Plan:   Problem List Items Addressed This Visit      Cardiovascular  and Mediastinum   Hypertension    Symptoms resolved ( ha, palpitations). Well controlled without medication. Patient will continue to monitor.       Relevant Medications   amLODipine (NORVASC) 5 MG tablet     Other   Abnormal TSH    Pending repeat thyroid studies.  Of note, patient will call and schedule mammogram      Relevant Orders   T3, free   T4, free   TSH   RESOLVED: Screening for breast cancer - Primary   Relevant Orders   MM 3D SCREEN BREAST BILATERAL    Other Visit Diagnoses    Screen for colon cancer       Relevant Orders   Ambulatory referral to Gastroenterology       I am having Rachel Hinton maintain her amLODipine.   No orders of the defined types were placed in this encounter.   Return precautions given.    Risks, benefits, and alternatives of the medications and treatment plan prescribed today were discussed, and patient expressed understanding.   Education regarding symptom management and diagnosis given to patient on AVS.  Continue to follow with Rachel Hawthorne, FNP for routine health maintenance.   Rachel Hinton and I agreed with plan.   Rachel Paris, FNP

## 2019-09-25 LAB — TSH: TSH: 3.89 u[IU]/mL (ref 0.35–4.50)

## 2019-09-25 LAB — T3, FREE: T3, Free: 3.4 pg/mL (ref 2.3–4.2)

## 2019-09-25 LAB — T4, FREE: Free T4: 0.74 ng/dL (ref 0.60–1.60)

## 2019-10-29 ENCOUNTER — Encounter: Payer: Self-pay | Admitting: Gastroenterology

## 2019-11-21 ENCOUNTER — Encounter: Payer: Self-pay | Admitting: Family

## 2019-12-06 ENCOUNTER — Ambulatory Visit (AMBULATORY_SURGERY_CENTER): Payer: Self-pay | Admitting: *Deleted

## 2019-12-06 ENCOUNTER — Other Ambulatory Visit: Payer: Self-pay

## 2019-12-06 VITALS — Ht 63.0 in | Wt 169.0 lb

## 2019-12-06 DIAGNOSIS — Z01818 Encounter for other preprocedural examination: Secondary | ICD-10-CM

## 2019-12-06 DIAGNOSIS — Z1211 Encounter for screening for malignant neoplasm of colon: Secondary | ICD-10-CM

## 2019-12-06 NOTE — Progress Notes (Signed)
covid test [place] 6-22 Tuesday   No egg or soy allergy known to patient  No issues with past sedation with any surgeries  or procedures, no intubation problems  No diet pills per patient No home 02 use per patient  No blood thinners per patient  Pt denies issues with constipation  No A fib or A flutter  EMMI video sent to pt's e mail   Due to the COVID-19 pandemic we are asking patients to follow these guidelines. Please only bring one care partner. Please be aware that your care partner may wait in the car in the parking lot or if they feel like they will be too hot to wait in the car, they may wait in the lobby on the 4th floor. All care partners are required to wear a mask the entire time (we do not have any that we can provide them), they need to practice social distancing, and we will do a Covid check for all patient's and care partners when you arrive. Also we will check their temperature and your temperature. If the care partner waits in their car they need to stay in the parking lot the entire time and we will call them on their cell phone when the patient is ready for discharge so they can bring the car to the front of the building. Also all patient's will need to wear a mask into building.

## 2019-12-18 ENCOUNTER — Other Ambulatory Visit: Payer: Self-pay

## 2019-12-18 ENCOUNTER — Other Ambulatory Visit
Admission: RE | Admit: 2019-12-18 | Discharge: 2019-12-18 | Disposition: A | Discharge status: Home / Self Care | Payer: Medicaid Other | Source: Ambulatory Visit | Attending: Gastroenterology | Admitting: Gastroenterology

## 2019-12-18 DIAGNOSIS — Z20822 Contact with and (suspected) exposure to covid-19: Secondary | ICD-10-CM | POA: Diagnosis not present

## 2019-12-18 DIAGNOSIS — Z01812 Encounter for preprocedural laboratory examination: Secondary | ICD-10-CM | POA: Insufficient documentation

## 2019-12-18 LAB — SARS CORONAVIRUS 2 (TAT 6-24 HRS): SARS Coronavirus 2: NEGATIVE

## 2019-12-20 ENCOUNTER — Encounter: Payer: Self-pay | Admitting: Gastroenterology

## 2019-12-20 ENCOUNTER — Other Ambulatory Visit: Payer: Self-pay

## 2019-12-20 ENCOUNTER — Ambulatory Visit (AMBULATORY_SURGERY_CENTER): Payer: Self-pay | Admitting: Gastroenterology

## 2019-12-20 VITALS — BP 124/57 | HR 54 | Temp 98.0°F | Resp 11 | Ht 63.0 in | Wt 169.0 lb

## 2019-12-20 DIAGNOSIS — Z1211 Encounter for screening for malignant neoplasm of colon: Secondary | ICD-10-CM

## 2019-12-20 HISTORY — PX: COLONOSCOPY: SHX174

## 2019-12-20 MED ORDER — SODIUM CHLORIDE 0.9 % IV SOLN: 500.0000 mL | Freq: Once | INTRAVENOUS | Status: DC

## 2019-12-20 NOTE — Op Note (Signed)
[facility] Patient Name: Rachel Hinton Procedure Date: 12/20/2019 10:04 AM MRN: [id number] Endoscopist: Justice Britain , MD Age: 54 Referring MD:  Date of Birth: 08/13/1965 Gender: Female Account #: [id number] Procedure:                Colonoscopy Indications:              Screening for colorectal malignant neoplasm, This                            is the patient's first colonoscopy Medicines:                Monitored Anesthesia Care Procedure:                Pre-Anesthesia Assessment:                           - Prior to the procedure, a History and Physical                            was performed, and patient medications and                            allergies were reviewed. The patient's tolerance of                            previous anesthesia was also reviewed. The risks                            and benefits of the procedure and the sedation                            options and risks were discussed with the patient.                            All questions were answered, and informed consent                            was obtained. Prior Anticoagulants: The patient has                            taken no previous anticoagulant or antiplatelet                            agents. ASA Grade Assessment: II - A patient with                            mild systemic disease. After reviewing the risks                            and benefits, the patient was deemed in                            satisfactory condition to undergo the procedure.  After obtaining informed consent, the colonoscope                            was passed under direct vision. Throughout the                            procedure, the patient's blood pressure, pulse, and                            oxygen saturations were monitored continuously. The                            Colonoscope was introduced through the anus and                            advanced to the 5 cm  into the ileum. The                            colonoscopy was performed without difficulty. The                            patient tolerated the procedure. The quality of the                            bowel preparation was good. The terminal ileum,                            ileocecal valve, appendiceal orifice, and rectum                            were photographed. Scope In: 10:25:19 AM Scope Out: 10:42:43 AM Scope Withdrawal Time: 0 hours 12 minutes 25 seconds  Total Procedure Duration: 0 hours 17 minutes 24 seconds  Findings:                 The digital rectal exam findings include                            hemorrhoids. Pertinent negatives include no                            palpable rectal lesions.                           The terminal ileum and ileocecal valve appeared                            normal.                           Normal mucosa was found in the entire colon.                           Non-bleeding non-thrombosed internal hemorrhoids  were found during retroflexion, during perianal                            exam and during digital exam. The hemorrhoids were                            Grade II (internal hemorrhoids that prolapse but                            reduce spontaneously). Complications:            No immediate complications. Estimated Blood Loss:     Estimated blood loss was minimal. Impression:               - Hemorrhoids found on digital rectal exam.                           - The examined portion of the ileum was normal.                           - Normal mucosa in the entire examined colon.                           - Non-bleeding non-thrombosed internal hemorrhoids. Recommendation:           - The patient will be observed post-procedure,                            until all discharge criteria are met.                           - Discharge patient to home.                           - Patient has a contact number  available for                            emergencies. The signs and symptoms of potential                            delayed complications were discussed with the                            patient. Return to normal activities tomorrow.                            Written discharge instructions were provided to the                            patient.                           - High fiber diet.                           - Use FiberCon 1-2 tablets PO daily.                           -  Continue present medications.                           - Repeat colonoscopy in 10 years for screening                            purposes.                           - The findings and recommendations were discussed                            with the patient. Justice Britain, MD 12/20/2019 10:47:12 AM

## 2019-12-20 NOTE — Progress Notes (Signed)
Pt's states no medical or surgical changes since previsit or office visit.  Vitals VV 

## 2019-12-20 NOTE — Patient Instructions (Signed)
YOU HAD AN ENDOSCOPIC PROCEDURE TODAY AT THE [facility]:   Refer to the procedure report that was given to you for any specific questions about what was found during the examination.  If the procedure report does not answer your questions, please call your gastroenterologist to clarify.  If you requested that your care partner not be given the details of your procedure findings, then the procedure report has been included in a sealed envelope for you to review at your convenience later.  YOU SHOULD EXPECT: Some feelings of bloating in the abdomen. Passage of more gas than usual.  Walking can help get rid of the air that was put into your GI tract during the procedure and reduce the bloating. If you had a lower endoscopy (such as a colonoscopy or flexible sigmoidoscopy) you may notice spotting of blood in your stool or on the toilet paper. If you underwent a bowel prep for your procedure, you may not have a normal bowel movement for a few days.  Please Note:  You might notice some irritation and congestion in your nose or some drainage.  This is from the oxygen used during your procedure.  There is no need for concern and it should clear up in a day or so.  SYMPTOMS TO REPORT IMMEDIATELY:   Following lower endoscopy (colonoscopy or flexible sigmoidoscopy):  Excessive amounts of blood in the stool  Significant tenderness or worsening of abdominal pains  Swelling of the abdomen that is new, acute  Fever of 100F or higher  For urgent or emergent issues, a gastroenterologist can be reached at any hour by calling [phone]. Do not use MyChart messaging for urgent concerns.    DIET:  We do recommend a small meal at first, but then you may proceed to your regular diet.  Drink plenty of fluids but you should avoid alcoholic beverages for 24 hours.  ACTIVITY:  You should plan to take it easy for the rest of today and you should NOT DRIVE or use heavy machinery until tomorrow (because  of the sedation medicines used during the test).    FOLLOW UP: Our staff will call the number listed on your records 48-72 hours following your procedure to check on you and address any questions or concerns that you may have regarding the information given to you following your procedure. If we do not reach you, we will leave a message.  We will attempt to reach you two times.  During this call, we will ask if you have developed any symptoms of COVID 19. If you develop any symptoms (ie: fever, flu-like symptoms, shortness of breath, cough etc.) before then, please call [phone].  If you test positive for Covid 19 in the 2 weeks post procedure, please call and report this information to us.    If any biopsies were taken you will be contacted by phone or by letter within the next 1-3 weeks.  Please call us at [phone] if you have not heard about the biopsies in 3 weeks.    SIGNATURES/CONFIDENTIALITY: You and/or your care partner have signed paperwork which will be entered into your electronic medical record.  These signatures attest to the fact that that the information above on your After Visit Summary has been reviewed and is understood.  Full responsibility of the confidentiality of this discharge information lies with you and/or your care-partner. 

## 2019-12-20 NOTE — Progress Notes (Signed)
pt tolerated well. VSS. awake and to recovery. Report given to RN.  

## 2019-12-24 ENCOUNTER — Telehealth: Payer: Self-pay | Admitting: *Deleted

## 2019-12-24 NOTE — Telephone Encounter (Signed)
Follow up call made, not available.

## 2019-12-24 NOTE — Telephone Encounter (Signed)
Have you developed a fever since your procedure? no 2.   Have you had an respiratory symptoms (SOB or cough) since your procedure? no  3.   Have you tested positive for COVID 19 since your procedure no  4.   Have you had any family members/close contacts diagnosed with the COVID 19 since your procedure?  no   If yes to any of these questions please route to Laverna Peace, RN and Charlett Lango, RN Follow up Call-  Call back number 12/20/2019  Post procedure Call Back phone  # [phone]  Permission to leave phone message Yes  Some recent data might be hidden     Patient questions:  Do you have a fever, pain , or abdominal swelling? No. Pain Score  0 *  Have you tolerated food without any problems? Yes.    Have you been able to return to your normal activities? Yes.    Do you have any questions about your discharge instructions: Diet   No. Medications  No. Follow up visit  No.  Do you have questions or concerns about your Care? No.  Actions: * If pain score is 4 or above: No action needed, pain <4.

## 2020-03-28 ENCOUNTER — Other Ambulatory Visit (HOSPITAL_COMMUNITY)
Admission: RE | Admit: 2020-03-28 | Discharge: 2020-03-28 | Disposition: A | Discharge status: Home / Self Care | Payer: Medicaid Other | Source: Ambulatory Visit | Attending: Family | Admitting: Family

## 2020-03-28 ENCOUNTER — Encounter: Payer: Self-pay | Admitting: Family

## 2020-03-28 ENCOUNTER — Other Ambulatory Visit: Payer: Self-pay

## 2020-03-28 ENCOUNTER — Ambulatory Visit (INDEPENDENT_AMBULATORY_CARE_PROVIDER_SITE_OTHER): Payer: Medicaid Other | Admitting: Family

## 2020-03-28 VITALS — BP 102/68 | HR 73 | Temp 98.0°F | Ht 63.0 in | Wt 173.8 lb

## 2020-03-28 DIAGNOSIS — Z0000 Encounter for general adult medical examination without abnormal findings: Secondary | ICD-10-CM | POA: Insufficient documentation

## 2020-03-28 DIAGNOSIS — Z23 Encounter for immunization: Secondary | ICD-10-CM

## 2020-03-28 NOTE — Assessment & Plan Note (Signed)
In setting of obesity, advised to start walking program and educated provided regarding low glycemic index foods. CBE and pap performed. She will schedule mammogram.

## 2020-03-28 NOTE — Progress Notes (Signed)
Subjective:    Patient ID: Rachel Hinton, female    DOB: [birth year], 54 y.o.   MRN: [id number]  CC: Rachel Hinton is a 54 y.o. female who presents today for physical exam.    HPI: Feels well today No complaints    Colorectal Cancer Screening: UTD , 10 year repeat Breast Cancer Screening: Mammogram due Cervical Cancer Screening: due         Tetanus - due         Hepatitis C screening - Candidate for, consents HIV Screening- Candidate for , consents Labs: Screening labs today. Exercise: No regular exercise.   Alcohol use:  none Smoking/tobacco use: Nonsmoker.   No new skin lesions.   HISTORY:  Past Medical History:  Diagnosis Date  . Hypertension    past hx HTN= off Amlodipine - off meds x 2 years   . 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 CESAREAN SECTION    . COLONOSCOPY  12/20/2019  . SHOULDER ARTHROSCOPY WITH DISTAL CLAVICLE RESECTION Right 07/19/2016   Procedure: SHOULDER ARTHROSCOPY, DEBRIDEMENT PARTIAL ROTATOR CUFF TEAR, SUBACROMIAL DECOMPRESSION, DISTAL CLAVICAL EXCISION;  Surgeon: Rachel Broom, MD;  Location: [facility];  Service: Orthopedics;  Laterality: Right;  . SUBACROMIAL DECOMPRESSION Right 07/19/2016   Procedure: SUBACROMIAL DECOMPRESSION;  Surgeon: Rachel Broom, MD;  Location: [facility];  Service: Orthopedics;  Laterality: Right;   Family History  Problem Relation Age of Onset  . Hypertension Mother   . Heart disease Mother   . Prostate cancer Paternal Uncle   . Colon polyps Neg Hx   . Crohn's disease Neg Hx   . Esophageal cancer Neg Hx   . Rectal cancer Neg Hx   . Stomach cancer Neg Hx       ALLERGIES: Acyclovir and related  No current outpatient medications on file prior to visit.   No current facility-administered medications on file prior to visit.    Social History   Tobacco Use  . Smoking status: Never Smoker  .  Smokeless tobacco: Never Used  Substance Use Topics  . Alcohol use: No    Alcohol/week: 0.0 standard drinks  . Drug use: No    Review of Systems  Constitutional: Negative for chills, fever and unexpected weight change.  HENT: Negative for congestion.   Respiratory: Negative for cough.   Cardiovascular: Negative for chest pain, palpitations and leg swelling.  Gastrointestinal: Negative for nausea and vomiting.  Musculoskeletal: Negative for arthralgias and myalgias.  Skin: Negative for rash.  Neurological: Negative for headaches.  Hematological: Negative for adenopathy.  Psychiatric/Behavioral: Negative for confusion.      Objective:    BP 102/68   Pulse 73   Temp 98 F (36.7 C)   Ht 5\' 3"  (1.6 m)   Wt 173 lb 12.8 oz (78.8 kg)   LMP 07/13/2014   SpO2 98%   BMI 30.79 kg/m   BP Readings from Last 3 Encounters:  03/28/20 102/68  12/20/19 (!) 124/57  09/24/19 110/70   Wt Readings from Last 3 Encounters:  03/28/20 173 lb 12.8 oz (78.8 kg)  12/20/19 169 lb (76.7 kg)  12/06/19 169 lb (76.7 kg)    Physical Exam Vitals reviewed.  Constitutional:      Appearance: She is well-developed.  Eyes:     Conjunctiva/sclera: Conjunctivae normal.  Neck:     Thyroid: No thyroid mass or thyromegaly.  Cardiovascular:     Rate  and Rhythm: Normal rate and regular rhythm.     Pulses: Normal pulses.     Heart sounds: Normal heart sounds.  Pulmonary:     Effort: Pulmonary effort is normal.     Breath sounds: Normal breath sounds. No wheezing, rhonchi or rales.  Chest:     Breasts: Breasts are symmetrical.        Right: No inverted nipple, mass, nipple discharge, skin change or tenderness.        Left: No inverted nipple, mass, nipple discharge, skin change or tenderness.  Genitourinary:    Cervix: No cervical motion tenderness, discharge or friability.     Uterus: Not enlarged, not fixed and not tender.      Adnexa:        Right: No mass, tenderness or fullness.         Left:  No mass, tenderness or fullness.       Comments: Pap performed. No CMT. Unable to appreciated ovaries. Lymphadenopathy:     Head:     Right side of head: No submental, submandibular, tonsillar, preauricular, posterior auricular or occipital adenopathy.     Left side of head: No submental, submandibular, tonsillar, preauricular, posterior auricular or occipital adenopathy.     Cervical:     Right cervical: No superficial, deep or posterior cervical adenopathy.    Left cervical: No superficial, deep or posterior cervical adenopathy.     Upper Body:     Right upper body: No pectoral adenopathy.     Left upper body: No pectoral adenopathy.  Skin:    General: Skin is warm and dry.  Neurological:     Mental Status: She is alert.  Psychiatric:        Speech: Speech normal.        Behavior: Behavior normal.        Thought Content: Thought content normal.        Assessment & Plan:   Problem List Items Addressed This Visit      Other   Routine physical examination - Primary    In setting of obesity, advised to start walking program and educated provided regarding low glycemic index foods. CBE and pap performed. She will schedule mammogram.       Relevant Orders   Cytology - PAP   CBC with Differential/Platelet   Comprehensive metabolic panel   Hemoglobin A1c   Hepatitis C antibody   HIV Antibody (routine testing w rflx)   Lipid panel   VITAMIN D 25 Hydroxy (Vit-D Deficiency, Fractures)   MM 3D SCREEN BREAST BILATERAL    Other Visit Diagnoses    Need for Tdap vaccination       Relevant Orders   Tdap vaccine greater than or equal to 7yo IM (Completed)       I have discontinued Rachel Hinton's amLODipine and multivitamin.   No orders of the defined types were placed in this encounter.   Return precautions given.   Risks, benefits, and alternatives of the medications and treatment plan prescribed today were discussed, and patient expressed understanding.   Education  regarding symptom management and diagnosis given to patient on AVS.   Continue to follow with Rachel Grana, FNP for routine health maintenance.   [facility] and I agreed with plan.   Rachel Plowman, FNP

## 2020-03-28 NOTE — Patient Instructions (Addendum)
Start walking program We will look at your weight in 3 months time   This is  Dr. Melina Schools  example of a  "Low GI"  Diet:  It will allow you to lose 4 to 8  lbs  per month if you follow it carefully.  Your goal with exercise is a minimum of 30 minutes of aerobic exercise 5 days per week (Walking does not count once it becomes easy!)    All of the foods can be found at grocery stores and in bulk at [employer].  The Atkins protein bars and shakes are available in more varieties at Target, WalMart and [employer]'s Foods.     7 AM Breakfast:  Choose from the following:  Low carbohydrate Protein  Shakes (I recommend the  Premier Protein chocolate shakes,  EAS AdvantEdge "Carb Control" shakes  Or the Atkins shakes all are under 3 net carbs)     a scrambled egg/bacon/cheese burrito made with Mission's "carb balance" whole wheat tortilla  (about 10 net carbs )  Medical laboratory scientific officer (basically a quiche without the pastry crust) that is eaten cold and very convenient way to get your eggs.  8 carbs)  If you make your own protein shakes, avoid bananas and pineapple,  And use low carb greek yogurt or original /unsweetened almond or soy milk    Avoid cereal and bananas, oatmeal and cream of wheat and grits. They are loaded with carbohydrates!   10 AM: high protein snack:  Protein bar by Atkins (the snack size, under 200 cal, usually < 6 net carbs).    A stick of cheese:  Around 1 carb,  100 cal     Dannon Light n Fit [place] Yogurt  (80 cal, 8 carbs)  Other so called "protein bars" and Greek yogurts tend to be loaded with carbohydrates.  Remember, in food advertising, the word "energy" is synonymous for " carbohydrate."  Lunch:   A Sandwich using the bread choices listed, Can use any  Eggs,  lunchmeat, grilled meat or canned tuna), avocado, regular mayo/mustard  and cheese.  A Salad using blue cheese, ranch,  Goddess or vinagrette,  Avoid taco shells, croutons or "confetti" and no "candied  nuts" but regular nuts OK.   No pretzels, nabs  or chips.  Pickles and miniature sweet peppers are a good low carb alternative that provide a "crunch"  The bread is the only source of carbohydrate in a sandwich and  can be decreased by trying some of the attached alternatives to traditional loaf bread   Avoid "Low fat dressings, as well as Reyne Dumas and [employer] Foods dressings They are loaded with sugar!   3 PM/ Mid day  Snack:  Consider  1 ounce of  almonds, walnuts, pistachios, pecans, peanuts,  Macadamia nuts or a nut medley.  Avoid "granola and granola bars "  Mixed nuts are ok in moderation as long as there are no raisins,  cranberries or dried fruit.   KIND bars are OK if you get the low glycemic index variety   Try the prosciutto/mozzarella cheese sticks by Fiorruci  In deli /backery section   High protein      6 PM  Dinner:     Meat/fowl/fish with a green salad, and either broccoli, cauliflower, green beans, spinach, brussel sprouts or  Lima beans. DO NOT BREAD THE PROTEIN!!      There is a low carb pasta by Dreamfield's that is acceptable and tastes great: only 5  digestible carbs/serving.( All grocery stores but BJs carry it ) Several ready made meals are available low carb:   Try Michel Angelo's chicken piccata or chicken or eggplant parm over low carb pasta.(Lowes and BJs)   Clifton Custard Sanchez's "Carnitas" (pulled pork, no sauce,  0 carbs) or his beef pot roast to make a dinner burrito (at BJ's)  Pesto over low carb pasta (bj's sells a good quality pesto in the center refrigerated section of the deli   Try satueeing  Roosvelt Harps with mushroooms as a good side   Green Giant makes a mashed cauliflower that tastes like mashed potatoes  Whole wheat pasta is still full of digestible carbs and  Not as low in glycemic index as Dreamfield's.   Sinkler rice is still rice,  So skip the rice and noodles if you eat [place] or [place] (or at least limit to 1/2 cup)  9 PM snack :   Breyer's "low carb"  fudgsicle or  ice cream bar (Carb Smart line), or  Weight Watcher's ice cream bar , or another "no sugar added" ice cream;  a serving of fresh berries/cherries with whipped cream   Cheese or DANNON'S LlGHT N FIT GREEK YOGURT  8 ounces of [place] unsweetened almond/cococunut milk    Treat yourself to a parfait made with whipped cream blueberiies, walnuts and vanilla greek yogurt  Avoid bananas, pineapple, grapes  and watermelon on a regular basis because they are high in sugar.  THINK OF THEM AS DESSERT  Remember that snack Substitutions should be less than 10 NET carbs per serving and meals < 20 carbs. Remember to subtract fiber grams to get the "net carbs."  @TULLOBREADPACKAGE @

## 2020-03-31 ENCOUNTER — Other Ambulatory Visit: Payer: Self-pay | Admitting: Family

## 2020-03-31 ENCOUNTER — Other Ambulatory Visit: Payer: Self-pay

## 2020-03-31 DIAGNOSIS — E785 Hyperlipidemia, unspecified: Secondary | ICD-10-CM

## 2020-03-31 DIAGNOSIS — E559 Vitamin D deficiency, unspecified: Secondary | ICD-10-CM

## 2020-03-31 DIAGNOSIS — R7303 Prediabetes: Secondary | ICD-10-CM

## 2020-03-31 LAB — CBC WITH DIFFERENTIAL/PLATELET
Absolute Monocytes: 452 cells/uL (ref 200–950)
Basophils Absolute: 44 cells/uL (ref 0–200)
Basophils Relative: 0.5 %
Eosinophils Absolute: 139 cells/uL (ref 15–500)
Eosinophils Relative: 1.6 %
HCT: 37.8 % (ref 35.0–45.0)
Hemoglobin: 13.1 g/dL (ref 11.7–15.5)
Lymphs Abs: 2862 cells/uL (ref 850–3900)
MCH: 30.5 pg (ref 27.0–33.0)
MCHC: 34.7 g/dL (ref 32.0–36.0)
MCV: 87.9 fL (ref 80.0–100.0)
MPV: 11.3 fL (ref 7.5–12.5)
Monocytes Relative: 5.2 %
Neutro Abs: 5203 cells/uL (ref 1500–7800)
Neutrophils Relative %: 59.8 %
Platelets: 222 10*3/uL (ref 140–400)
RBC: 4.3 10*6/uL (ref 3.80–5.10)
RDW: 13.5 % (ref 11.0–15.0)
Total Lymphocyte: 32.9 %
WBC: 8.7 10*3/uL (ref 3.8–10.8)

## 2020-03-31 LAB — COMPREHENSIVE METABOLIC PANEL
AG Ratio: 1.7 (calc) (ref 1.0–2.5)
ALT: 13 U/L (ref 6–29)
AST: 16 U/L (ref 10–35)
Albumin: 4.3 g/dL (ref 3.6–5.1)
Alkaline phosphatase (APISO): 87 U/L (ref 37–153)
BUN: 13 mg/dL (ref 7–25)
CO2: 27 mmol/L (ref 20–32)
Calcium: 9.3 mg/dL (ref 8.6–10.4)
Chloride: 104 mmol/L (ref 98–110)
Creat: 0.8 mg/dL (ref 0.50–1.05)
Globulin: 2.5 g/dL (calc) (ref 1.9–3.7)
Glucose, Bld: 87 mg/dL (ref 65–99)
Potassium: 4 mmol/L (ref 3.5–5.3)
Sodium: 140 mmol/L (ref 135–146)
Total Bilirubin: 0.3 mg/dL (ref 0.2–1.2)
Total Protein: 6.8 g/dL (ref 6.1–8.1)

## 2020-03-31 LAB — HEMOGLOBIN A1C
Hgb A1c MFr Bld: 5.7 % of total Hgb — ABNORMAL HIGH (ref <5.7)
Mean Plasma Glucose: 117 (calc)
eAG (mmol/L): 6.5 (calc)

## 2020-03-31 LAB — LIPID PANEL
Cholesterol: 213 mg/dL — ABNORMAL HIGH (ref <200)
HDL: 38 mg/dL — ABNORMAL LOW (ref >=50)
Non-HDL Cholesterol (Calc): 175 mg/dL (calc) — ABNORMAL HIGH (ref <130)
Total CHOL/HDL Ratio: 5.6 (calc) — ABNORMAL HIGH (ref <5.0)
Triglycerides: 461 mg/dL — ABNORMAL HIGH (ref <150)

## 2020-03-31 LAB — VITAMIN D 25 HYDROXY (VIT D DEFICIENCY, FRACTURES): Vit D, 25-Hydroxy: 19 ng/mL — ABNORMAL LOW (ref 30–100)

## 2020-03-31 LAB — HIV ANTIBODY (ROUTINE TESTING W REFLEX): HIV 1&2 Ab, 4th Generation: NONREACTIVE

## 2020-03-31 LAB — HEPATITIS C ANTIBODY
Hepatitis C Ab: NONREACTIVE
SIGNAL TO CUT-OFF: 0.01 (ref <1.00)

## 2020-04-01 LAB — CYTOLOGY - PAP
Comment: NEGATIVE
Diagnosis: NEGATIVE
High risk HPV: NEGATIVE

## 2020-04-14 ENCOUNTER — Encounter: Payer: Medicaid Other | Attending: Family | Admitting: Dietician

## 2020-04-14 ENCOUNTER — Encounter: Payer: Self-pay | Admitting: Dietician

## 2020-04-14 ENCOUNTER — Other Ambulatory Visit: Payer: Self-pay

## 2020-04-14 VITALS — Ht 63.0 in | Wt 173.4 lb

## 2020-04-14 DIAGNOSIS — R7303 Prediabetes: Secondary | ICD-10-CM

## 2020-04-14 NOTE — Progress Notes (Signed)
Medical Nutrition Therapy: Visit start time: 1315  end time: 1500  Assessment:  Diagnosis: prediabetes Past medical history: none Psychosocial issues/ stress concerns: pt rates stress level as 'moderate' and feels 'ok' about stress management skills   Preferred learning method:  . Auditory . Visual . Hands-on  Current weight: 173.4 lbs  Height: 5'3" Medications, supplements: reconciled in medical record  Labs A1c 5.7(H) on 03/28/2020  Progress and evaluation:   Pt lives with husband and 43 year old son, reports she plans the meals and she and her husband cook meals and buy groceries   Pt reports several motivators being a good example for her son  Pt states her goal is to lower her A1c down to the healthy/normal range    Pt states PCP recommended she take vitamin D supplement   Pt reports a recent drop in energy level, feels more tired than usual at the end of the day   Pt states she had her thyroid tested and test came back negative   Physical activity: ADLs, pt interested in incorporating some exercise  Dietary Intake:  Usual eating pattern includes 2-3 meals and 1 snacks per day. Dining out frequency: 3 meals per week.  Breakfast (7-730am): boiled egg with oatmeal and apple and toast, 1 tbsp of sugar in whole pot   Lunch: skips usually; eats lunch 2-3x week may be pizza with pepsi, quesadilla with chips on side  Supper: steak/pork chops/fish at least once a week with steamed veggies; fast food; [place]  Snack: cookies, cake, 8+ sweets a week  Beverages: water 64-80oz a day, 1 bottle of soda, decaf coffee no sugar   Nutrition Care Education: Basic nutrition: basic food groups, appropriate nutrient balance, appropriate meal and snack schedule, general nutrition guidelines    Weight control: importance of low sugar and low fat choices, portion control strategies Advanced nutrition: dining out, food label reading Diabetes: appropriate meal and snack schedule,  appropriate carb intake and balance, healthy carb choices, role of fiber, protein, fat Hypertension:  importance of controlling BP, identifying high sodium foods, identifying food sources of potassium, magnesium Hyperlipidemia:  healthy and unhealthy fats, role of fiber  Nutritional Diagnosis:  NB-1.1 Food and nutrition-related knowledge deficit As related to new prediabetes diagnosis.  As evidenced by pt diet recall, pt new to diagnosis, information pt provided during discussion along with questions. [place]-2.2 Altered nutrition-related laboratory As related to prediabetes.  As evidenced by pt recent A1c of 5.7 on 03/28/2020.  Intervention:  Discussion and instruction as noted above.  Additionally, pt recently diagnosed as prediabetic and highly motivated to make changes to diet.  Pt reported diabetes runs on her husband's side of the family and feels motivated to keep family healthy.  Reviewed meaning of A1c and use for diagnosis of prediabetes and diabetes.  Answered pt questions regarding specific foods and carb counting.  Reviewed some sources of vitamin d.  Discussed how to manage sweets cravings in the evening.    Increase fruit and vegetable intake   Incorporate vegetables at lunch and dinner   Do not skip lunch  Try frozen veggies that come in microwaveable pouches (may be tender than fresh and low prep time)  Include fruit (mandarin oranges, peaches, pears, applesauce) for snacks + protein (cheese, PB)   Decrease sugar-sweetened beverage consumption   Switch from regular to diet soda  Continue to drink at least 64oz water daily (add crystal light, lemon, or mio as needed)    Increase physical activity- discuss  at follow up   Incrementally reintroduce exercise back into routine   150 minutes per week is recommendation   Try chair yoga or seated exercises on YouTube   Increase fiber intake   Eat at least 3 servings of whole grains a day  Increase fruit and vegetable  intake  Decrease sodium intake   Reduce sodium intake to recommended 1500mg /day   Read nutrition labels on packaged foods to monitor sodium intake   400-600 mg/meal  <200 mg/snack   Decrease fast food frequency   Avoid processed meats    Decrease saturated fat intake   Limit processed meats   Switch to low fat dairy products   Education Materials given:  . General diet guidelines for Diabetes . Carb counting and meal planning booklet  . Food record . Plate Planner with food lists . Goals/ instructions  Learner/ who was taught:  . Patient   Level of understanding: Verbalizes/ demonstrates competency  Demonstrated degree of understanding via:   Teach back Learning barriers: . None  Willingness to learn/ readiness for change: . Eager, change in progress  Monitoring and Evaluation:  Dietary intake, exercise, A1c, and body weight      follow up: December 6th at 1:15 pm

## 2020-04-14 NOTE — Patient Instructions (Signed)
   Portion control carbs   2-3 servings at meals   1-2 servings at snacks   Switch to diet soda   When snacking, have a protein with carbs

## 2020-05-30 ENCOUNTER — Ambulatory Visit
Admission: RE | Admit: 2020-05-30 | Discharge: 2020-05-30 | Disposition: A | Discharge status: Home / Self Care | Payer: Medicaid Other | Source: Ambulatory Visit | Attending: Family | Admitting: Family

## 2020-05-30 ENCOUNTER — Other Ambulatory Visit: Payer: Self-pay

## 2020-05-30 DIAGNOSIS — Z0000 Encounter for general adult medical examination without abnormal findings: Secondary | ICD-10-CM

## 2020-05-30 DIAGNOSIS — Z1231 Encounter for screening mammogram for malignant neoplasm of breast: Secondary | ICD-10-CM | POA: Insufficient documentation

## 2020-06-02 ENCOUNTER — Encounter: Payer: Self-pay | Attending: Family | Admitting: Dietician

## 2020-06-02 ENCOUNTER — Telehealth: Payer: Self-pay

## 2020-06-02 ENCOUNTER — Encounter: Payer: Self-pay | Admitting: Dietician

## 2020-06-02 ENCOUNTER — Other Ambulatory Visit: Payer: Self-pay

## 2020-06-02 VITALS — Wt 169.1 lb

## 2020-06-02 DIAGNOSIS — R7303 Prediabetes: Secondary | ICD-10-CM | POA: Insufficient documentation

## 2020-06-02 NOTE — Progress Notes (Signed)
Medical Nutrition Therapy: Visit start time: 1300  end time: 1400  Assessment:  Diagnosis: prediabetes  Medical history changes: none Psychosocial issues/ stress concerns: none; pt reports boost in mood and energy  Current weight: 169.1 lbs  Height: 5'3" Medications, supplement changes: reconciled in medical record   Progress and evaluation:  . Weight change: -4.3 lbs since initial visit 7 weeks ago  o Weight loss rate of 0.6 lbs/week . Pt reports she has made the following changes  o Cut the soda out of her diet completely  o Has started to exercise- walking most days   o Decreased how many eggs yolks she eats per week, adding more fiber-rich cereal/granola/oatmeal in place of eggs  o Started taking her vitamin d supplement  o Snacking on more fruit greek yogurt, instead of cookies and ice cream  o Choosing [place] and chicken on sandwiches more than ham o Started using a plate with sections to help with portion control, reports this has been helpful tool   . Pt states has been in the process of moving over the past week and has not been able to cook as much due to move, relied more of fast food and dining out past week  . Pt states she usually sleeps 7-8 hours at night  . Pt states she has noticed less cravings for sweets by adding more carbs to meals  . Pt reports noticing clothes are fitting better  . Pt reports boost in mood and energy     Physical activity: starting with 2-3x/week, now walking 5 days a week, 30 min   Dietary Intake:  Usual eating pattern includes 2-3 meals and 1-2 snacks per day. Dining out frequency: 3 meals per week.  Breakfast: eggs; cereal/granola with greek yogurt Snack: same as below  Lunch: sandwich with side salad  Snack: cheese and celery, chocolate protein bar (14g), PB crackers  Supper: steak/pork chops/fish at least once a week with steamed veggies; fast food; [employer]  Snack: cut out cookies/cakes/sweets as a daily evening snack  Beverages:  water, decaf coffee, no more soda    Nutrition Care Education: Weight control: reviewed progress since previous visit, determining reasonable weight loss rate, portion control strategies Advanced nutrition: dining out Prediabetes: appropriate meal and snack schedule, appropriate carb intake and balance, role of exercise Hyperlipidemia: plant sterols, appropriate food choices Other lifestyle changes:  80/20 rule (structure with flexibility), stress management, sleep quality   Nutritional Diagnosis:  [place]-2.2 Altered nutrition-related laboratory As related to prediabetes.  As evidenced by pt recent A1c of 5.7 on 03/28/2020.  Intervention:  Discussion and instruction as noted above.  Pt remains highly motivated to improve A1c and cholesterol.  Praised pt for all the great progress she has made since the initial visit.  Discussed importance of making changes at a sustainable rate.  Pt planning on having labs drawn again in January.  Will schedule follow up some time after this visit.  Established goals for additional change.     Maintain fruit and vegetable intake   Include fruit (mandarin oranges, peaches, pears, applesauce) for snacks + protein (cheese, PB)  maintain great physical activity routine- 150 minutes/week recommendation   Continue to limit sodium intake to 1500mg /day   Increase fiber intake   Eat at least 3 servings of whole grains a day  Increase fruit and vegetable intake  Education Materials given:  Stress management resource . Sleep quality resource  . Plate Planner with food lists . Dining out resource .  Goals/ instructions  Learner/ who was taught:  . Patient   Level of understanding: Marland Kitchen Verbalizes/ demonstrates competency  Demonstrated degree of understanding via:   Teach back Learning barriers: . None  Willingness to learn/ readiness for change: . Eager, change in progress  Monitoring and Evaluation:  Dietary intake, exercise, lipid panel, and body  weight      follow up: Monday, February 7th at 1:15pm

## 2020-06-02 NOTE — Progress Notes (Signed)
LMTCB to call for mammo results.  

## 2020-06-02 NOTE — Telephone Encounter (Signed)
LMTCB to call for mammo results.

## 2020-06-02 NOTE — Patient Instructions (Signed)
   Continue to snack mindfully- try hummus   Keep up the great physical activity   Remember the the 80/20 rule

## 2020-06-04 NOTE — Telephone Encounter (Signed)
Pt notified of normal results.

## 2020-06-04 NOTE — Telephone Encounter (Signed)
Patient returned office phone call. If possible please call patient after 12:30pm, thank you.

## 2020-06-30 ENCOUNTER — Other Ambulatory Visit: Payer: Medicaid Other

## 2020-07-04 ENCOUNTER — Encounter: Payer: Self-pay | Admitting: Family

## 2020-07-04 ENCOUNTER — Telehealth (INDEPENDENT_AMBULATORY_CARE_PROVIDER_SITE_OTHER): Payer: BC Managed Care – PPO | Admitting: Family

## 2020-07-04 VITALS — Ht 63.0 in

## 2020-07-04 DIAGNOSIS — U071 COVID-19: Secondary | ICD-10-CM | POA: Diagnosis not present

## 2020-07-04 DIAGNOSIS — E785 Hyperlipidemia, unspecified: Secondary | ICD-10-CM | POA: Insufficient documentation

## 2020-07-04 DIAGNOSIS — R7989 Other specified abnormal findings of blood chemistry: Secondary | ICD-10-CM | POA: Diagnosis not present

## 2020-07-04 DIAGNOSIS — Z1321 Encounter for screening for nutritional disorder: Secondary | ICD-10-CM | POA: Diagnosis not present

## 2020-07-04 DIAGNOSIS — E782 Mixed hyperlipidemia: Secondary | ICD-10-CM

## 2020-07-04 NOTE — Assessment & Plan Note (Addendum)
Encouraged plenty of fluid. Advised to follow Health at Work return to work guidelines. Counseled on vitamins to take, including vit c, vit d, zinc, quercetin.Remain in quarantine for at least 10 days.

## 2020-07-04 NOTE — Patient Instructions (Signed)
Purchase pulse oximeter to monitor oxygenation saturation. If you oxygen were to drop < 90%, this is an emergency and you may need oxygen support. You would need to seek in person evaluation at the emergency room or call 911.   Please start Mucinex DM to take at bedtime for cough if needed.      Please begin Vit C 1000 mg daily   Vitamin D3 4000 IU daily   Zinc 50 mg daily   Quercetin 250 mg -500 mg twice daily ; this an antioxidant which reduces inflammation. You can find it at places such as [employer] and [employer] however not limited to these stores.   Once you are better, you may STOP all the above supplements.   Rest, hydrate very well, eat healthy protein food, Tylenol or Advil as directed .    Please go to Acute Care if symptoms worsen but are not an emergency. Office video visit again early next week.    Remain in self quarantine until at least 10 days since symptom onset AND 3 consecutive days fever free without antipyretics AND improvement in respiratory symptoms.    Utilize over the counter medications to treat symptoms.   Seek  treatment in the ED if respiratory issues/distress develops or unrelieved chest pain. Or, if you become severely weak, dehydrated, confused.    Only leave home to seek medical care and must wear a mask in public. Limit contact with family members or caregivers in the home and to notify her family who she was with yesterday that they should quarantine for 14 days. Practice social distancing and to continue to use good preventative care measures such has frequent hand washing.      Your COVID-19 test was resulted as positive.    You should continue your self imposed quantarine until you can answer "yes" to ALL 3 of the conditions below:   1) Your symptoms (fever, cough, shortness of breath) started 7 or more days ago   2) Your body temperature  has been normal for at least 72 hours (WITHOUT the use of any tylenol, motrin or aleve) . Normal is <  100.4 Farenheit  3) your other flu  like symptoms symptoms are getting better.   YOUR FAMILY or other household contacts, however , even if they feel fine , will need to continue to quarantining themselves  (that means no contact with ANYONE outside of the house)  for 14 days STARTING from the end of your initial 7 day period . (why? because they have been theoretically exposed to you during the entire 7 days of your illness, and they can sheD the virus without symptoms for that period of time ).         10 Things You Can Do to Manage Your COVID-19 Symptoms at Home If you have possible or confirmed COVID-19: 1. Stay home from work and school. And stay away from other public places. If you must go out, avoid using any kind of public transportation, ridesharing, or taxis. 2. Monitor your symptoms carefully. If your symptoms get worse, call your healthcare provider immediately. 3. Get rest and stay hydrated. 4. If you have a medical appointment, call the healthcare provider ahead of time and tell them that you have or may have COVID-19. 5. For medical emergencies, call 911 and notify the dispatch personnel that you have or may have COVID-19. 6. Cover your cough and sneezes with a tissue or use the inside of your elbow. 7. Wash  your hands often with soap and water for at least 20 seconds or clean your hands with an alcohol-based hand sanitizer that contains at least 60% alcohol. 8. As much as possible, stay in a specific room and away from other people in your home. Also, you should use a separate bathroom, if available. If you need to be around other people in or outside of the home, wear a mask. 9. Avoid sharing personal items with other people in your household, like dishes, towels, and bedding. 10. Clean all surfaces that are touched often, like counters, tabletops, and doorknobs. Use household cleaning sprays or wipes according to the label instructions. [employer] 12/27/2018

## 2020-07-04 NOTE — Progress Notes (Signed)
Verbal consent for services obtained from patient prior to services given to TELEPHONE visit:   Location of call:  provider at work patient at home  Names of all persons present for services: Rennie Plowman, NP and patient Chief complaint: covid positive. She tested 5 days ago. Husband has covid which is why she tested per [facility] health and work.  Endorses nasal congestion.  She feels well and hasnt had to take any medications. Eating and drinking however less.  No fever, sob. Very occassional cough.  covid vaccinated.   H/o prediabetes- has seen nutritionist which was helpful.   Elevated triglycerides. Lab scheduled.    A/P/next steps:  Problem List Items Addressed This Visit      Other   Abnormal TSH - Primary   Relevant Orders   TSH   COVID-19    Encouraged plenty of fluid. Advised to follow Health at Work return to work guidelines. Counseled on vitamins to take, including vit c, vit d, zinc, quercetin.Remain in quarantine for at least 10 days.       Elevated triglycerides with high cholesterol    Pending repeat of lipid panel.       Relevant Orders   Lipid panel    Other Visit Diagnoses    Encounter for vitamin deficiency screening       Relevant Orders   VITAMIN D 25 Hydroxy (Vit-D Deficiency, Fractures)       I spent 15 min  discussing plan of care over the phone.

## 2020-07-04 NOTE — Assessment & Plan Note (Signed)
Pending repeat of lipid panel.

## 2020-07-23 ENCOUNTER — Other Ambulatory Visit (INDEPENDENT_AMBULATORY_CARE_PROVIDER_SITE_OTHER): Payer: BC Managed Care – PPO

## 2020-07-23 ENCOUNTER — Other Ambulatory Visit: Payer: Self-pay

## 2020-07-23 DIAGNOSIS — R7989 Other specified abnormal findings of blood chemistry: Secondary | ICD-10-CM

## 2020-07-23 DIAGNOSIS — E782 Mixed hyperlipidemia: Secondary | ICD-10-CM | POA: Diagnosis not present

## 2020-07-23 DIAGNOSIS — Z1321 Encounter for screening for nutritional disorder: Secondary | ICD-10-CM

## 2020-07-23 LAB — LIPID PANEL
Cholesterol: 220 mg/dL — ABNORMAL HIGH (ref 0–200)
HDL: 43 mg/dL (ref >39.00)
NonHDL: 176.87
Total CHOL/HDL Ratio: 5
Triglycerides: 204 mg/dL — ABNORMAL HIGH (ref 0.0–149.0)
VLDL: 40.8 mg/dL — ABNORMAL HIGH (ref 0.0–40.0)

## 2020-07-23 LAB — LDL CHOLESTEROL, DIRECT: Direct LDL: 133 mg/dL

## 2020-07-23 LAB — TSH: TSH: 3.77 u[IU]/mL (ref 0.35–4.50)

## 2020-07-23 LAB — VITAMIN D 25 HYDROXY (VIT D DEFICIENCY, FRACTURES): VITD: 33.1 ng/mL (ref 30.00–100.00)

## 2020-08-04 ENCOUNTER — Encounter: Payer: Self-pay | Admitting: Dietician

## 2020-08-04 ENCOUNTER — Encounter: Payer: BC Managed Care – PPO | Attending: Family | Admitting: Dietician

## 2020-08-04 ENCOUNTER — Other Ambulatory Visit: Payer: Self-pay

## 2020-08-04 VITALS — Ht 63.0 in | Wt 165.8 lb

## 2020-08-04 DIAGNOSIS — R7303 Prediabetes: Secondary | ICD-10-CM | POA: Diagnosis not present

## 2020-08-04 NOTE — Patient Instructions (Signed)
   Great job making healthy food choices and controlling portions! Keep it up!  Find an enjoyable exercise that does not cause ankle pain -- bicycle, water exercise, or other. Start with a short time and gradually increase as you are able.

## 2020-08-04 NOTE — Progress Notes (Signed)
Medical Nutrition Therapy: Visit start time: 1315  end time: 1345  Assessment:  Diagnosis: pre-diabetes Medical history changes: no changes Psychosocial issues/ stress concerns: none  Current weight: 165.8lbs  Height: 5'3" Medications, supplement changes: no medications  Progress and evaluation:  . Patient feels diet is going well; she is confused due to slight increase in total and LDL cholesterol despite making positive changes. . She reports having had COVID several weeks ago, with no symptoms; now recovered.   Physical activity: none at this time sue to ankle pain when walking  Dietary Intake:  Usual eating pattern includes 2-3 meals and 1-2 snacks per day. Dining out frequency:   Breakfast: boiled egg with oatmeal, toast and fruit; scrambled egg with onion and bell peppers + fruit Snack: none Lunch: sometimes skips; protein bar; crackers when working weekdays; weekends small waffle with granola + egg Snack: none or fruit Supper: baked or grilled meats ie pork chop, chicken, steak; fish once a week or more + steamed veggies; not as much pizza; salad with dried fruit/ granola Snack: now substituting fruit, protein bar, granola, Greek yogurt for snacks rather than sweets Beverages: water, decaf coffee  Nutrition Care Education: Topics covered:  Basic nutrition: appropriate nutrient balance, appropriate meal and snack schedule    Weight control: reviewed progress since previous visit, determining reasonable weight loss rate Pre-Diabetes:  reviewed appropriate carb intake and balance, healthy carb choices for lower glycemic response, role of fiber, protein; role of physical activity and options for activity Hyperlipidemia:  healthy and unhealthy fats, role of fiber and soluble fiber, appropriate food choices   Nutritional Diagnosis:  [place]-2.2 Altered nutrition-related laboratory As related to pre-diabetes, hyperlipidemia.  As evidenced by recent HbA1C of 5.7%, total cholesterol  220mg /dl, LDL . New -3.3 Overweight/obesity As related to history of inadequate physical activity and excess calories.  As evidenced by patient with current BMI of 29, following calorie-controlled eating pattern for ongoing weight loss.  Intervention:  . Instruction and discussion as noted above. . Patient continues to lose weight steadily and follow healthy eating pattern, and is motivated to continue further. 509 Updated goals with input from patient.  . No further follow-up scheduled at this time; patient will schedule later if needed.  Education Materials given:  Marland Kitchen Visit summary with goals/ instructions   Learner/ who was taught:  . Patient   Level of understanding: Marland Kitchen Verbalizes/ demonstrates competency   Demonstrated degree of understanding via:   Teach back Learning barriers: . None  Willingness to learn/ readiness for change: . Eager, change in progress   Monitoring and Evaluation:  Dietary intake, exercise, BG control, and body weight      follow up: prn

## 2022-01-12 IMAGING — MG DIGITAL SCREENING BILAT W/ TOMO W/ CAD
8 series · 8 of 24 positions shown · non-contrast
Comparison: Previous exam(s).

CLINICAL DATA: Screening.

EXAM:
DIGITAL SCREENING BILATERAL MAMMOGRAM WITH TOMO AND CAD

[L CC synth-2D]
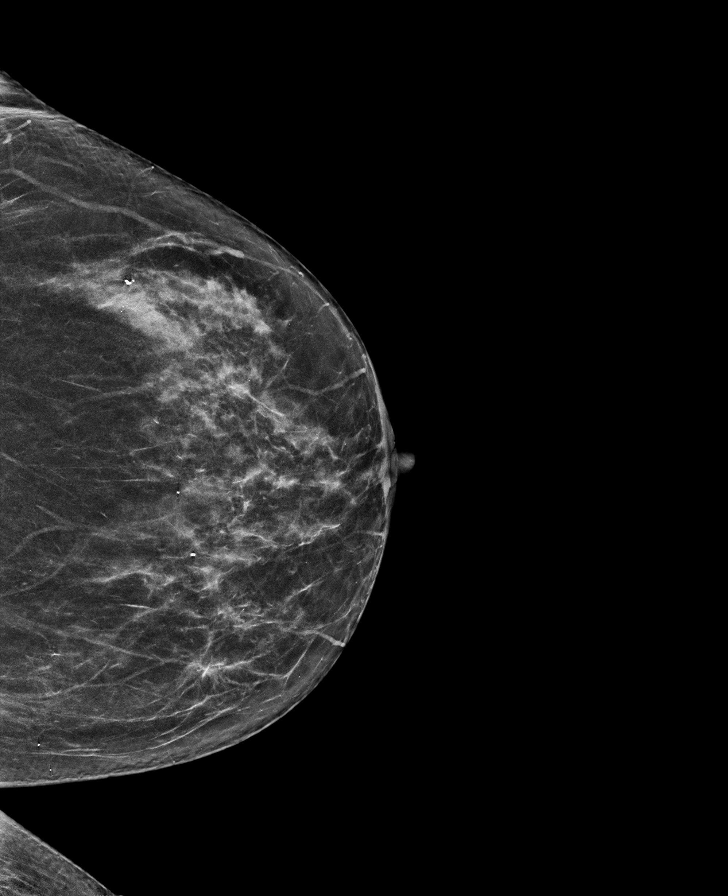

[R MLO synth-2D]
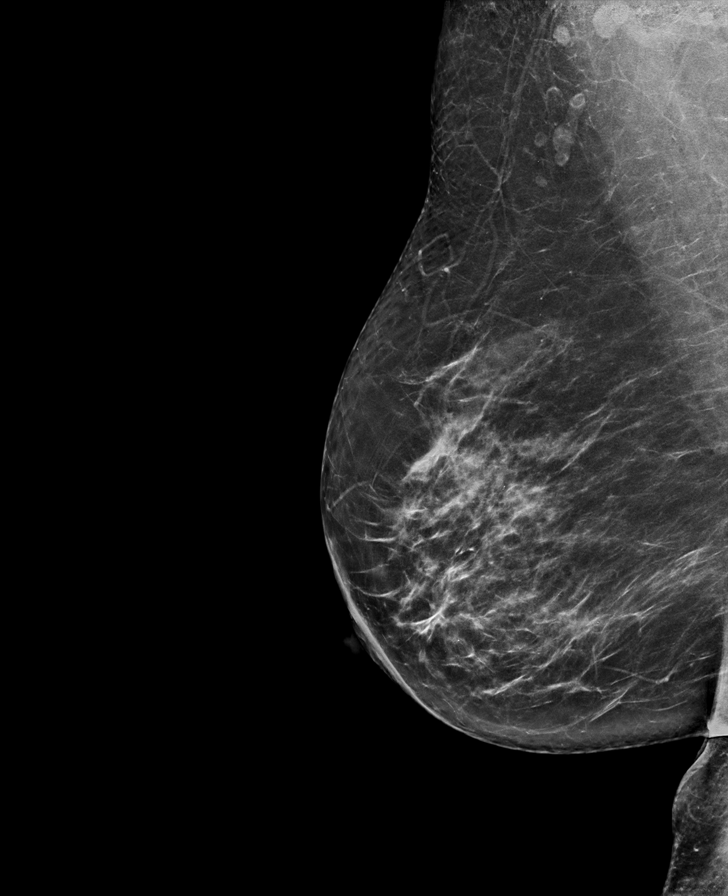

[L MLO synth-2D]
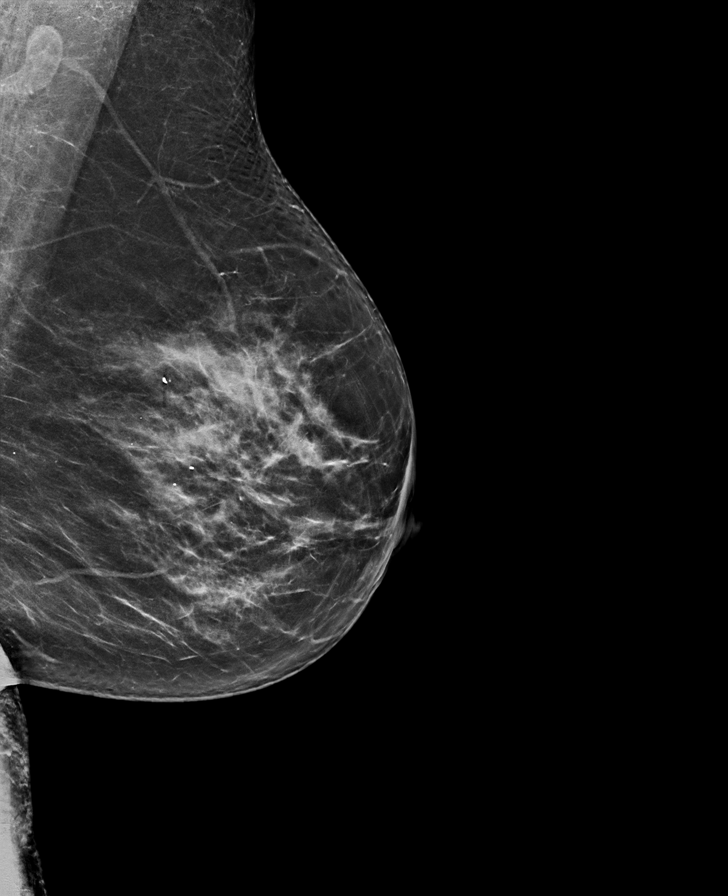

[R CC synth-2D]
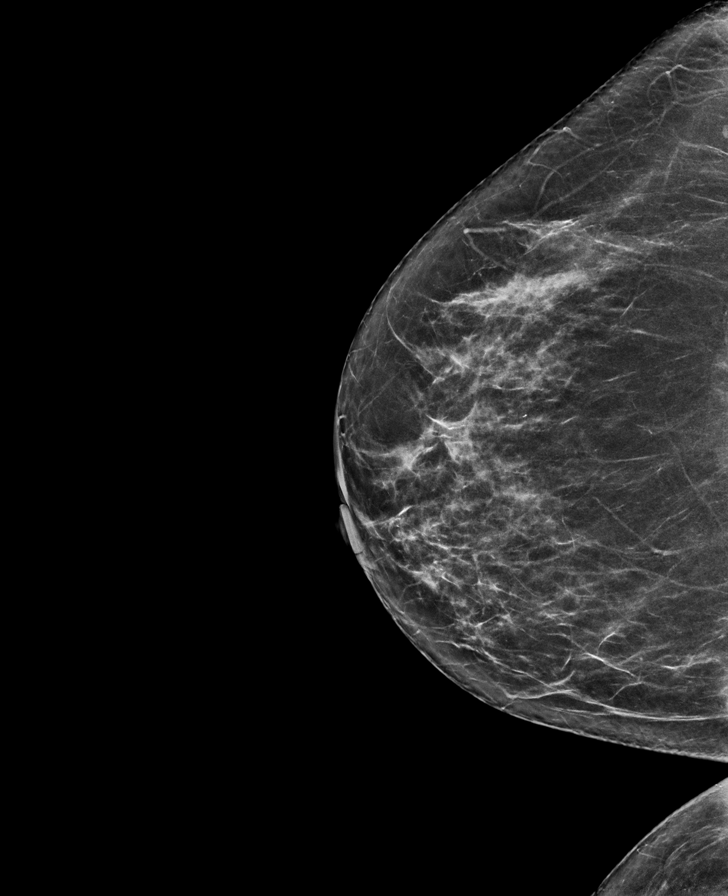

[R MLO tomo · tomo slice 36/71.0]
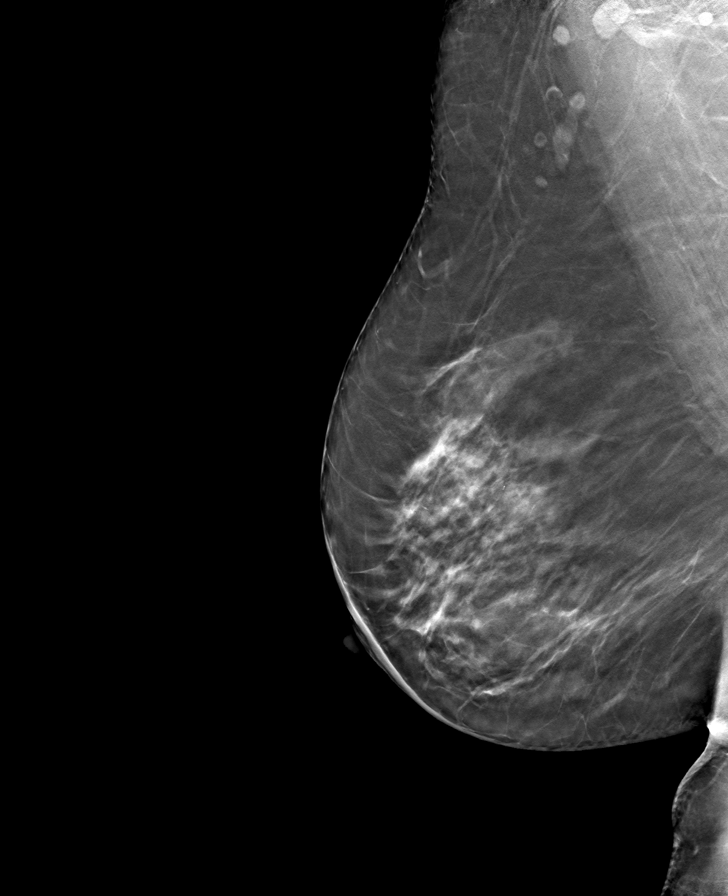

[L CC tomo · tomo slice 32/63.0]
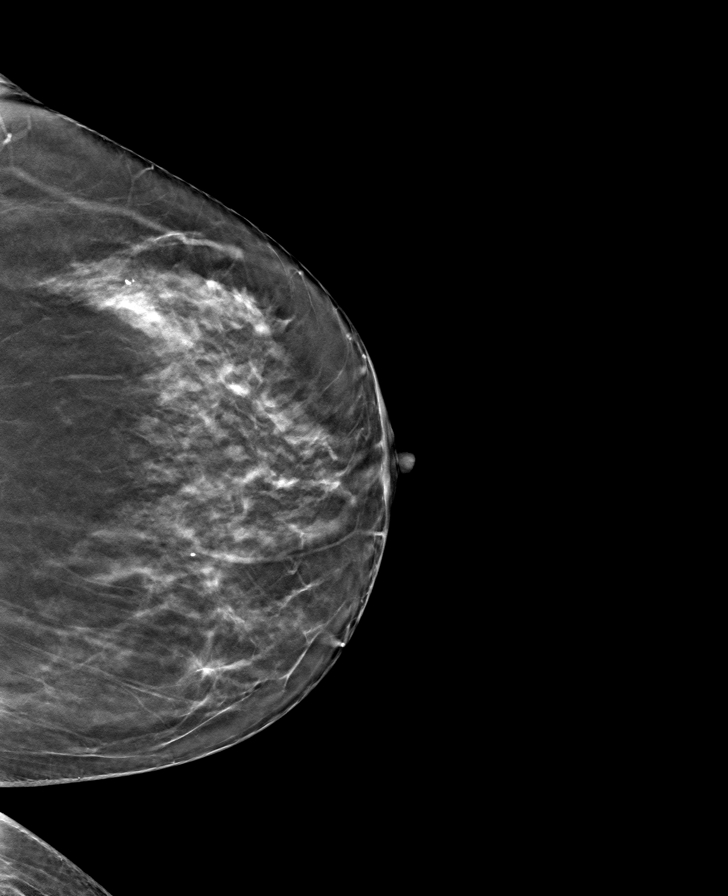

[L MLO tomo · tomo slice 35/70.0]
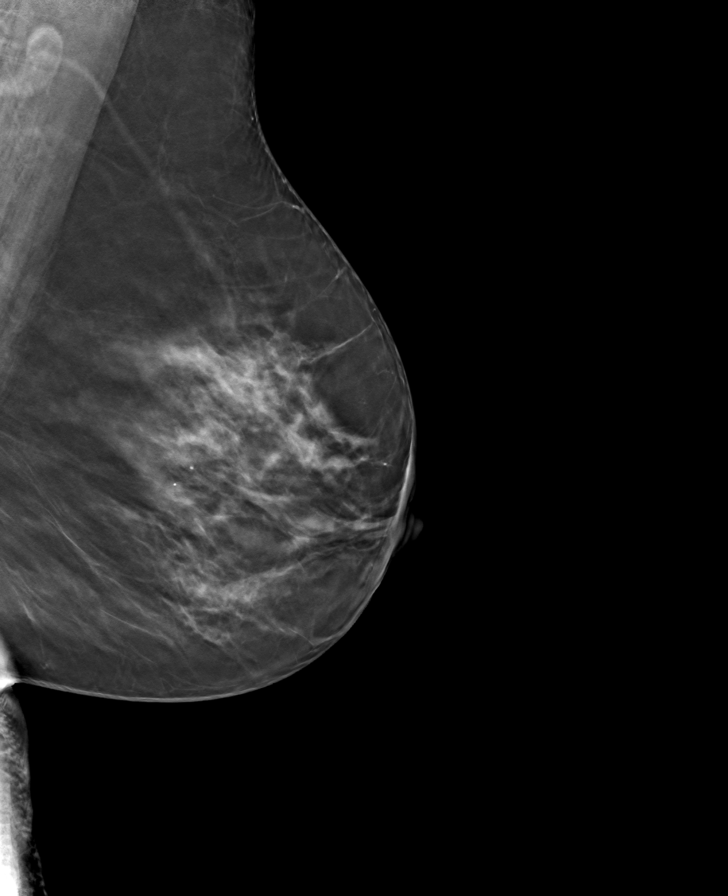

[R CC tomo · tomo slice 33/65.0]
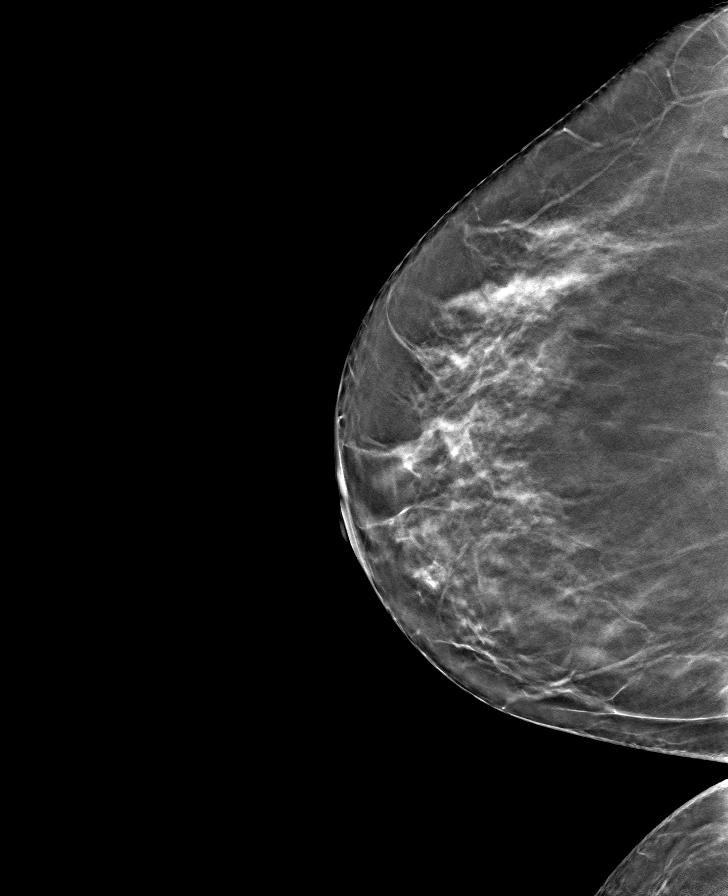

[8 of 24 positions shown; findings below may reference images not displayed]

ACR Breast Density Category c: The breast tissue is heterogeneously
dense, which may obscure small masses.
FINDINGS: There are no findings suspicious for malignancy. Images were
processed with CAD.
IMPRESSION: No mammographic evidence of malignancy. A result letter of this
screening mammogram will be mailed directly to the patient.

RECOMMENDATION:
Screening mammogram in one year. (Code:[id number]-[id number])

BI-RADS CATEGORY  1: Negative.

## 2022-08-12 ENCOUNTER — Encounter: Payer: Self-pay | Admitting: Nurse Practitioner

## 2022-08-12 ENCOUNTER — Ambulatory Visit (INDEPENDENT_AMBULATORY_CARE_PROVIDER_SITE_OTHER): Payer: Self-pay | Admitting: Nurse Practitioner

## 2022-08-12 VITALS — BP 120/70 | HR 69 | Temp 98.4°F | Ht 63.0 in | Wt 165.0 lb

## 2022-08-12 DIAGNOSIS — Z1231 Encounter for screening mammogram for malignant neoplasm of breast: Secondary | ICD-10-CM

## 2022-08-12 DIAGNOSIS — H9312 Tinnitus, left ear: Secondary | ICD-10-CM

## 2022-08-12 DIAGNOSIS — H00014 Hordeolum externum left upper eyelid: Secondary | ICD-10-CM | POA: Insufficient documentation

## 2022-08-12 MED ORDER — ERYTHROMYCIN 5 MG/GM OP OINT: TOPICAL_OINTMENT | OPHTHALMIC | 0 refills | Status: DC

## 2022-08-12 NOTE — Patient Instructions (Addendum)
Erythromycin eye ointment sent to Pharmacy. Use warm compress. Will refer to ENT for tinnitus.

## 2022-08-12 NOTE — Assessment & Plan Note (Signed)
Will refer to ENT for further evaluation.

## 2022-08-12 NOTE — Assessment & Plan Note (Signed)
Started on erythromycin ointment for 7 days.   Use warm compress.

## 2022-08-12 NOTE — Progress Notes (Signed)
Established Patient Office Visit  Subjective:  Patient ID: Rachel Hinton, female    DOB: 1966-01-29  Age: 57 y.o. MRN: [id number]  CC:  Chief Complaint  Patient presents with   Acute Visit    Left eye swollen/itchy/ear ringing X 4 days      HPI  Rachel Hinton presents for swollen left eye and that started 4 days ago also complaint with some neck stiffness.  She was using cold compress without significant improvement.  She also complains of tinnitus of left ear since last 1  year.  She is not taking any medication at present.    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     Past Surgical History:  Procedure Laterality Date   CESAREAN SECTION     COLONOSCOPY  12/20/2019   SHOULDER ARTHROSCOPY WITH DISTAL CLAVICLE RESECTION Right 07/19/2016   Procedure: SHOULDER ARTHROSCOPY, DEBRIDEMENT PARTIAL ROTATOR CUFF TEAR, SUBACROMIAL DECOMPRESSION, DISTAL CLAVICAL EXCISION;  Surgeon: Tania Ade, MD;  Location: [facility];  Service: Orthopedics;  Laterality: Right;   SUBACROMIAL DECOMPRESSION Right 07/19/2016   Procedure: SUBACROMIAL DECOMPRESSION;  Surgeon: Tania Ade, MD;  Location: [place];  Service: Orthopedics;  Laterality: Right;    Family History  Problem Relation Age of Onset   Hypertension Mother    Heart disease Mother    Prostate cancer Paternal Uncle    Colon polyps Neg Hx    Crohn's disease Neg Hx    Esophageal cancer Neg Hx    Rectal cancer Neg Hx    Stomach cancer Neg Hx     Social History   Socioeconomic History   Marital status: Married    Spouse name: Not on file   Number of children: Not on file   Years of education: Not on file   Highest education level: Not on file  Occupational History   Not on file  Tobacco Use   Smoking status: Never   Smokeless tobacco: Never  Substance and  Sexual Activity   Alcohol use: No    Alcohol/week: 0.0 standard drinks of alcohol   Drug use: No   Sexual activity: Not on file  Other Topics Concern   Not on file  Social History Narrative   Housekeeper   Married   20 year old son   English is second language   Social Determinants of Radio broadcast assistant Strain: Not on file  Food Insecurity: Not on file  Transportation Needs: Not on file  Physical Activity: Not on file  Stress: Not on file  Social Connections: Not on file  Intimate Partner Violence: Not on file     No outpatient medications prior to visit.   No facility-administered medications prior to visit.    Allergies  Allergen Reactions   Acyclovir And Related     ROS Review of Systems  Constitutional: Negative.   HENT:  Positive for tinnitus.   Eyes:  Positive for itching.       Swollen left upper eyelid  Respiratory:  Negative for cough and chest tightness.   Cardiovascular: Negative.   Gastrointestinal: Negative.   Neurological: Negative.   Psychiatric/Behavioral: Negative.        Objective:    Physical Exam Constitutional:      Appearance: Normal appearance.  HENT:     Right Ear:  Tympanic membrane normal. No tenderness. There is no impacted cerumen.     Left Ear: Tympanic membrane normal. No tenderness. There is no impacted cerumen.  Eyes:     General:        Left eye: Hordeolum present. Cardiovascular:     Rate and Rhythm: Normal rate and regular rhythm.     Pulses: Normal pulses.     Heart sounds: Normal heart sounds.  Pulmonary:     Effort: Pulmonary effort is normal.     Breath sounds: Normal breath sounds.  Skin:    General: Skin is warm.  Neurological:     General: No focal deficit present.     Mental Status: She is alert and oriented to person, place, and time. Mental status is at baseline.     BP 120/70   Pulse 69   Temp 98.4 F (36.9 C) (Oral)   Ht 5' 3"$  (1.6 m)   Wt 165 lb (74.8 kg)   LMP 07/13/2014   SpO2  98%   BMI 29.23 kg/m  Wt Readings from Last 3 Encounters:  08/12/22 165 lb (74.8 kg)  08/04/20 165 lb 12.8 oz (75.2 kg)  06/02/20 169 lb 1.6 oz (76.7 kg)     Health Maintenance  Topic Date Due   Zoster Vaccines- Shingrix (1 of 2) Never done   MAMMOGRAM  05/30/2022   COVID-19 Vaccine (3 - 2023-24 season) 08/28/2022 (Originally 02/26/2022)   INFLUENZA VACCINE  09/26/2022 (Originally 01/26/2022)   PAP SMEAR-Modifier  03/29/2023   COLONOSCOPY (Pts 45-38yr Insurance coverage will need to be confirmed)  12/19/2029   DTaP/Tdap/Td (2 - Td or Tdap) 03/28/2030   Hepatitis C Screening  Completed   HIV Screening  Completed   HPV VACCINES  Aged Out    There are no preventive care reminders to display for this patient.  Lab Results  Component Value Date   TSH 3.77 07/23/2020   Lab Results  Component Value Date   WBC 8.7 03/28/2020   HGB 13.1 03/28/2020   HCT 37.8 03/28/2020   MCV 87.9 03/28/2020   PLT 222 03/28/2020   Lab Results  Component Value Date   NA 140 03/28/2020   K 4.0 03/28/2020   CO2 27 03/28/2020   GLUCOSE 87 03/28/2020   BUN 13 03/28/2020   CREATININE 0.80 03/28/2020   BILITOT 0.3 03/28/2020   ALKPHOS 106 02/14/2017   AST 16 03/28/2020   ALT 13 03/28/2020   PROT 6.8 03/28/2020   ALBUMIN 3.9 02/14/2017   CALCIUM 9.3 03/28/2020   GFR 86.45 02/14/2017   Lab Results  Component Value Date   CHOL 220 (H) 07/23/2020   Lab Results  Component Value Date   HDL 43.00 07/23/2020   Lab Results  Component Value Date   [facility]     Comment:     . LDL cholesterol not calculated. Triglyceride levels greater than 400 mg/dL invalidate calculated LDL results. . Reference range: <100 . Desirable range <100 mg/dL for primary prevention;   <70 mg/dL for patients with CHD or diabetic patients  with > or = 2 CHD risk factors. .Marland KitchenLDL-C is now calculated using the Martin-Hopkins  calculation, which is a validated novel method providing  better accuracy than  the Friedewald equation in the  estimation of LDL-C.  MCresenciano Genreet al. JAnnamaria Helling [id number]: [id number]  (http://education.[facility].com/faq/FAQ164)    Lab Results  Component Value Date   TRIG 204.0 (H) 07/23/2020   Lab Results  Component Value Date  CHOLHDL 5 07/23/2020   Lab Results  Component Value Date   HGBA1C 5.7 (H) 03/28/2020     Assessment & Plan:   Problem List Items Addressed This Visit       Other   Hordeolum of left upper eyelid - Primary    Started on erythromycin ointment for 7 days.   Use warm compress.       Tinnitus of left ear    Will refer to ENT for further evaluation.      Other Visit Diagnoses     Encounter for screening mammogram for malignant neoplasm of breast       Relevant Orders   MM 3D SCREEN BREAST BILATERAL        Meds ordered this encounter  Medications   erythromycin ophthalmic ointment    Sig: Use one half inch four times daily to affected eye (s) x 7 days.    Dispense:  3.5 g    Refill:  0     Follow-up: Return if symptoms worsen or fail to improve.    Theresia Lo, NP

## 2022-08-18 ENCOUNTER — Other Ambulatory Visit: Payer: Self-pay

## 2022-08-18 ENCOUNTER — Encounter: Payer: Self-pay | Admitting: Emergency Medicine

## 2022-08-18 ENCOUNTER — Emergency Department
Admission: EM | Admit: 2022-08-18 | Discharge: 2022-08-18 | Disposition: A | Discharge status: Home / Self Care | Payer: No Typology Code available for payment source | Attending: Emergency Medicine | Admitting: Emergency Medicine

## 2022-08-18 ENCOUNTER — Telehealth: Payer: Self-pay | Admitting: Family

## 2022-08-18 DIAGNOSIS — M542 Cervicalgia: Secondary | ICD-10-CM | POA: Diagnosis not present

## 2022-08-18 DIAGNOSIS — I10 Essential (primary) hypertension: Secondary | ICD-10-CM | POA: Insufficient documentation

## 2022-08-18 MED ORDER — IBUPROFEN 600 MG PO TABS: 600.0000 mg | ORAL_TABLET | Freq: Four times a day (QID) | ORAL | 0 refills | Status: DC | PRN

## 2022-08-18 MED ORDER — PREDNISONE 20 MG PO TABS: 40.0000 mg | ORAL_TABLET | Freq: Every day | ORAL | 0 refills | Status: DC

## 2022-08-18 NOTE — ED Triage Notes (Addendum)
Pt arrived via POV with reports of R side neck pain, pt states the pain started earlier in the day but worse tonight when she was trying to sleep, pt states the pain resembles when you sleep wrong at night, but the pain moves up the side of her head and describes the pain as  a tightening.  Pt states the pain also moves down the R arm but stops before the elbow.  Pt denies upper back pain, but states when you press on her upper shoulder it feels good. Pt states she has not taken any medications for the pain at this time.   Pt is able to move her neck from side to side without difficulty.  Pt currently using e-mycin ointment for hordeolum to eyes.

## 2022-08-18 NOTE — Discharge Instructions (Signed)
Take the prednisone daily for the next 5 days.  Take ibuprofen up to every 6 hours (with meals) as needed for pain.  Follow-up with your regular doctor.  Return to the ER for new, worsening, or persistent severe pain, headache, chest pain, weakness or numbness in the arm, difficulty breathing, dizziness, difficulty swallowing, fever, vomiting, or any other new or worsening symptoms that concern you.

## 2022-08-18 NOTE — ED Provider Notes (Signed)
[facility] Provider Note    Event Date/Time   First MD Initiated Contact with Patient 08/18/22 [phone]     (approximate)   History   Neck Pain   HPI  Rachel Hinton is a 57 y.o. female with a history of hypertension and prediabetes who presents with right-sided neck pain intermittently since yesterday afternoon, atraumatic, radiating down the right arm and up towards the right side of her head.  It also radiates to the right upper part of her chest.  She states it feels like a tightness, and sort of like when you sleep wrong on a part of your body.  She denies any weakness or numbness in the right arm.  She states that she had similar symptoms on the left side few days ago but that were less intense.  She had some ringing in the ear and was diagnosed with tinnitus.  She is scheduled to follow-up with ENT.  I viewed the past medical records.  The patient most recent outpatient encounter was with primary care on 2/15 for eyelid swelling diagnosed as a hordeolum.  She also reported tenderness at that time.   Physical Exam   Triage Vital Signs: ED Triage Vitals  Enc Vitals Group     BP 08/18/22 0242 (!) 146/82     Pulse Rate 08/18/22 0242 66     Resp 08/18/22 0242 16     Temp 08/18/22 0242 98.4 F (36.9 C)     Temp Source 08/18/22 0242 Oral     SpO2 08/18/22 0242 97 %     Weight 08/18/22 0238 165 lb (74.8 kg)     Height 08/18/22 0238 5' 3"$  (1.6 m)     Head Circumference --      Peak Flow --      Pain Score 08/18/22 0237 7     Pain Loc --      Pain Edu? --      Excl. in [place]? --     Most recent vital signs: Vitals:   08/18/22 0242 08/18/22 0530  BP: (!) 146/82 (!) 151/77  Pulse: 66 69  Resp: 16 18  Temp: 98.4 F (36.9 C) 98.2 F (36.8 C)  SpO2: 97% 100%     General: Awake, no distress.  CV:  Good peripheral perfusion.  Resp:  Normal effort.  Abd:  No distention.  Other:  EOMI.  PERRLA.  No facial droop.  Cranial nerves III through XII  grossly intact.  5/5 motor strength and intact sensation of bilateral upper extremities.  Full range of motion to neck.  Supple, nontender.  No lymphadenopathy.  Right external ear canal and TM appear normal.   ED Results / Procedures / Treatments   Labs (all labs ordered are listed, but only abnormal results are displayed) Labs Reviewed - No data to display   EKG  ED ECG REPORT I, Arta Silence, the attending physician, personally viewed and interpreted this ECG.  Date: 08/18/2022 EKG Time: 0458 Rate: 62 Rhythm: normal sinus rhythm QRS Axis: normal Intervals: normal ST/T Wave abnormalities: Nonspecific T wave flattening diffusely Narrative Interpretation: Nonspecific abnormalities with no evidence of acute ischemia; no recent prior EKG available for comparison    RADIOLOGY     PROCEDURES:  Critical Care performed: No  Procedures   MEDICATIONS ORDERED IN ED: Medications - No data to display   IMPRESSION / MDM / [place] / ED COURSE  I reviewed the triage vital signs and the nursing notes.  57 year old female presents with atraumatic right-sided neck pain rating to the right arm and the right side of the head as well as the upper part of the chest.  She has no weakness or numbness or other concerning symptoms.  Vital signs are normal.  The physical exam is unremarkable.  Differential diagnosis includes, but is not limited to, muscle strain/spasm, cervical radiculopathy, tension headache, or other benign etiology.  There is no indication for imaging or lab workup.  We will obtain an EKG to rule out cardiac etiology, although I do not suspect that this is cardiac related as the patient is having neck pain radiating to the chest rather than any primary chest pain.  Patient's presentation is most consistent with acute presentation with potential threat to life or bodily function.  EKG is unremarkable.  I counseled the patient on the result.  I have  prescribed prednisone and ibuprofen for symptomatic treatment of likely radicular pain.  I gave her strict return precautions and she expressed understanding.   FINAL CLINICAL IMPRESSION(S) / ED DIAGNOSES   Final diagnoses:  Neck pain     Rx / DC Orders   ED Discharge Orders          Ordered    predniSONE (DELTASONE) 20 MG tablet  Daily with breakfast        08/18/22 0524    ibuprofen (ADVIL) 600 MG tablet  Every 6 hours PRN        08/18/22 0524             Note:  This document was prepared using Dragon voice recognition software and may include unintentional dictation errors.    Arta Silence, MD 08/18/22 [phone]

## 2022-08-18 NOTE — Telephone Encounter (Signed)
Pt called wanting an update on a referral for ENT. Pt saw [facility] on 2/15

## 2022-08-19 ENCOUNTER — Other Ambulatory Visit: Payer: Self-pay | Admitting: Nurse Practitioner

## 2022-08-19 DIAGNOSIS — H9312 Tinnitus, left ear: Secondary | ICD-10-CM

## 2022-08-19 NOTE — Telephone Encounter (Signed)
Referral sent to ENT. 

## 2022-08-19 NOTE — Telephone Encounter (Signed)
Pt is aware ENT referral sent.  Scheduled [facility] with Joycelyn Schmid on 08/23/22

## 2022-08-20 ENCOUNTER — Telehealth: Payer: Self-pay

## 2022-08-20 NOTE — Transitions of Care (Post Inpatient/ED Visit) (Signed)
   08/20/2022  Name: Rachel Hinton MRN: [id number] DOB: [birth year]  Today's TOC FU Call Status: Today's TOC FU Call Status:: Unsuccessul Call (1st Attempt) Unsuccessful Call (1st Attempt) Date: 08/20/22  Attempted to reach the patient regarding the most recent Inpatient/ED visit.  Follow Up Plan: Additional outreach attempts will be made to reach the patient to complete the Transitions of Care (Post Inpatient/ED visit) call.   Signature Ferne Reus, RN

## 2022-08-23 ENCOUNTER — Ambulatory Visit (INDEPENDENT_AMBULATORY_CARE_PROVIDER_SITE_OTHER): Payer: No Typology Code available for payment source | Admitting: Family

## 2022-08-23 ENCOUNTER — Encounter: Payer: Self-pay | Admitting: Family

## 2022-08-23 ENCOUNTER — Ambulatory Visit (INDEPENDENT_AMBULATORY_CARE_PROVIDER_SITE_OTHER): Payer: No Typology Code available for payment source

## 2022-08-23 VITALS — BP 120/78 | HR 71 | Temp 98.4°F | Ht 63.0 in | Wt 168.0 lb

## 2022-08-23 DIAGNOSIS — R002 Palpitations: Secondary | ICD-10-CM | POA: Diagnosis not present

## 2022-08-23 DIAGNOSIS — M542 Cervicalgia: Secondary | ICD-10-CM

## 2022-08-23 DIAGNOSIS — R7303 Prediabetes: Secondary | ICD-10-CM

## 2022-08-23 DIAGNOSIS — I10 Essential (primary) hypertension: Secondary | ICD-10-CM

## 2022-08-23 DIAGNOSIS — H00014 Hordeolum externum left upper eyelid: Secondary | ICD-10-CM

## 2022-08-23 NOTE — Progress Notes (Signed)
Assessment & Plan:  Neck pain Assessment & Plan: Fortunately pain has resolved. Question where DDD versus soft tissue muscle spasm at that time. Cervical x-ray reveals mild spondylosis at C5-C6. Will follow.   Orders: -     B12 and Folate Panel; Future -     DG Cervical Spine Complete; Future  Hypertension, unspecified type -     Lipid panel; Future  Prediabetes -     CBC with Differential/Platelet; Future -     Hemoglobin A1c; Future  Palpitations -     B12 and Folate Panel; Future -     Comprehensive metabolic panel; Future  Hordeolum externum of left upper eyelid -     Ambulatory referral to Ophthalmology     Return precautions given.   Risks, benefits, and alternatives of the medications and treatment plan prescribed today were discussed, and patient expressed understanding.   Education regarding symptom management and diagnosis given to patient on AVS either electronically or printed.  Return in about 2 weeks (around 09/06/2022) for Fasting labs in 2-3 weeks.  Mable Paris, FNP  Subjective:    Patient ID: Rachel Hinton, female    DOB: 08-22-1965, 57 y.o.   MRN: [id number]  CC: Rachel Hinton is a 57 y.o. female who presents today for follow up.   HPI:  Neck pain and palpitations have resolved.   No cough, sob.  10 days ago she had right side anterior neck pain which radiated to right arm and chest . She was going to sleep when symptoms started.  Symptom associated with heart palpitations, right sided facial numbness, and nausea for a couple of days.  Palpitations resolved within a few seconds.   Drinks very little caffeine.   No associated syncope, dizziness, fever, posterior neck pain, shoulder pain, HA, numbness down either arm,  vision changes, sob, left arm numbness, tooth sensitivity, epigastric pain. She is chewing food without pain.   She was seen her 08/12/22 for left sided anterior neck pain radiating to left ear and teeth.  Initially neck  pain started on right side of neck ; since then she has left sided neck pain.       She has been using erythromycin for stye with improvement however seems to have returned.  She has been using warm compresses.  She has upcoming ENT appointment scheduled. 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 She is quite  physical at work.  No regular exercise.  She has planned dental procedure to replace crown however she reports no abscess.    Allergies: Patient has no known allergies. Current Outpatient Medications on File Prior to Visit  Medication Sig Dispense Refill   erythromycin ophthalmic ointment Use one half inch four times daily to affected eye (s) x 7 days. 3.5 g 0   ibuprofen (ADVIL) 600 MG tablet Take 1 tablet (600 mg total) by mouth every 6 (six) hours as needed. 30 tablet 0   No current facility-administered medications on file prior to visit.    Review of Systems  Constitutional:  Negative for chills and fever.  Respiratory:  Negative for cough.   Cardiovascular:  Negative for chest pain and palpitations (resolved).  Gastrointestinal:  Negative for nausea and vomiting.  Musculoskeletal:  Negative for neck pain (resolved).      Objective:    BP 120/78   Pulse 71   Temp 98.4 F (36.9 C) (Oral)   Ht '5\' 3"'$  (1.6 m)   Wt 168 lb (76.2  kg)   LMP 07/13/2014   SpO2 96%   BMI 29.76 kg/m  BP Readings from Last 3 Encounters:  08/23/22 120/78  08/18/22 (!) 151/77  08/12/22 120/70   Wt Readings from Last 3 Encounters:  08/23/22 168 lb (76.2 kg)  08/18/22 165 lb (74.8 kg)  08/12/22 165 lb (74.8 kg)    Physical Exam Vitals reviewed.  Constitutional:      Appearance: She is well-developed.  Eyes:     Conjunctiva/sclera: Conjunctivae normal.  Cardiovascular:     Rate and Rhythm: Normal rate and regular rhythm.     Pulses: Normal pulses.     Heart sounds: Normal heart sounds.  Pulmonary:     Effort: Pulmonary effort is normal.     Breath  sounds: Normal breath sounds. No wheezing, rhonchi or rales.  Skin:    General: Skin is warm and dry.  Neurological:     Mental Status: She is alert.  Psychiatric:        Speech: Speech normal.        Behavior: Behavior normal.        Thought Content: Thought content normal.

## 2022-08-30 NOTE — Assessment & Plan Note (Addendum)
Fortunately pain has resolved. Question where DDD versus soft tissue muscle spasm at that time. Cervical x-ray reveals mild spondylosis at C5-C6. Will follow.

## 2022-08-30 NOTE — Patient Instructions (Addendum)
Nice to see you today!

## 2022-09-01 ENCOUNTER — Telehealth: Payer: Self-pay

## 2022-09-01 NOTE — Telephone Encounter (Signed)
LVM to call back to office to go over results

## 2022-09-06 ENCOUNTER — Ambulatory Visit (INDEPENDENT_AMBULATORY_CARE_PROVIDER_SITE_OTHER): Payer: No Typology Code available for payment source | Admitting: Family

## 2022-09-06 ENCOUNTER — Telehealth: Payer: Self-pay | Admitting: Family

## 2022-09-06 ENCOUNTER — Other Ambulatory Visit (INDEPENDENT_AMBULATORY_CARE_PROVIDER_SITE_OTHER): Payer: No Typology Code available for payment source

## 2022-09-06 ENCOUNTER — Encounter: Payer: Self-pay | Admitting: Family

## 2022-09-06 VITALS — BP 124/72 | HR 86 | Temp 98.0°F | Ht 63.0 in | Wt 166.2 lb

## 2022-09-06 DIAGNOSIS — R002 Palpitations: Secondary | ICD-10-CM | POA: Diagnosis not present

## 2022-09-06 DIAGNOSIS — M542 Cervicalgia: Secondary | ICD-10-CM

## 2022-09-06 DIAGNOSIS — E785 Hyperlipidemia, unspecified: Secondary | ICD-10-CM | POA: Diagnosis not present

## 2022-09-06 DIAGNOSIS — E538 Deficiency of other specified B group vitamins: Secondary | ICD-10-CM | POA: Diagnosis not present

## 2022-09-06 DIAGNOSIS — R7303 Prediabetes: Secondary | ICD-10-CM | POA: Diagnosis not present

## 2022-09-06 DIAGNOSIS — I10 Essential (primary) hypertension: Secondary | ICD-10-CM

## 2022-09-06 LAB — LIPID PANEL
Cholesterol: 217 mg/dL — ABNORMAL HIGH (ref 0–200)
HDL: 42.9 mg/dL (ref >39.00)
NonHDL: 174.05
Total CHOL/HDL Ratio: 5
Triglycerides: 224 mg/dL — ABNORMAL HIGH (ref 0.0–149.0)
VLDL: 44.8 mg/dL — ABNORMAL HIGH (ref 0.0–40.0)

## 2022-09-06 LAB — B12 AND FOLATE PANEL
Folate: 16.6 ng/mL (ref >5.9)
Vitamin B-12: 269 pg/mL (ref 211–911)

## 2022-09-06 LAB — CBC WITH DIFFERENTIAL/PLATELET
Basophils Absolute: 0.1 10*3/uL (ref 0.0–0.1)
Basophils Relative: 0.7 % (ref 0.0–3.0)
Eosinophils Absolute: 0.2 10*3/uL (ref 0.0–0.7)
Eosinophils Relative: 2.4 % (ref 0.0–5.0)
HCT: 40.6 % (ref 36.0–46.0)
Hemoglobin: 13.3 g/dL (ref 12.0–15.0)
Lymphocytes Relative: 28.9 % (ref 12.0–46.0)
Lymphs Abs: 2.1 10*3/uL (ref 0.7–4.0)
MCHC: 32.9 g/dL (ref 30.0–36.0)
MCV: 89 fl (ref 78.0–100.0)
Monocytes Absolute: 0.4 10*3/uL (ref 0.1–1.0)
Monocytes Relative: 5 % (ref 3.0–12.0)
Neutro Abs: 4.5 10*3/uL (ref 1.4–7.7)
Neutrophils Relative %: 63 % (ref 43.0–77.0)
Platelets: 239 10*3/uL (ref 150.0–400.0)
RBC: 4.56 Mil/uL (ref 3.87–5.11)
RDW: 14 % (ref 11.5–15.5)
WBC: 7.2 10*3/uL (ref 4.0–10.5)

## 2022-09-06 LAB — COMPREHENSIVE METABOLIC PANEL
ALT: 12 U/L (ref 0–35)
AST: 15 U/L (ref 0–37)
Albumin: 3.8 g/dL (ref 3.5–5.2)
Alkaline Phosphatase: 66 U/L (ref 39–117)
BUN: 12 mg/dL (ref 6–23)
CO2: 29 mEq/L (ref 19–32)
Calcium: 9.3 mg/dL (ref 8.4–10.5)
Chloride: 104 mEq/L (ref 96–112)
Creatinine, Ser: 0.73 mg/dL (ref 0.40–1.20)
GFR: 91.58 mL/min (ref >60.00)
Glucose, Bld: 87 mg/dL (ref 70–99)
Potassium: 4.2 mEq/L (ref 3.5–5.1)
Sodium: 139 mEq/L (ref 135–145)
Total Bilirubin: 0.6 mg/dL (ref 0.2–1.2)
Total Protein: 6.7 g/dL (ref 6.0–8.3)

## 2022-09-06 LAB — LDL CHOLESTEROL, DIRECT: Direct LDL: 116 mg/dL

## 2022-09-06 LAB — HEMOGLOBIN A1C: Hgb A1c MFr Bld: 6.1 % (ref 4.6–6.5)

## 2022-09-06 NOTE — Assessment & Plan Note (Signed)
Pending further lab studies; advised OTC multivitamin which includes b12.

## 2022-09-06 NOTE — Progress Notes (Signed)
Called pt but no answer, no vm

## 2022-09-06 NOTE — Telephone Encounter (Signed)
I just added on b12 studies this afternoon to pts labs from [place]  Can we add?   If not, she can come back

## 2022-09-06 NOTE — Progress Notes (Signed)
Assessment & Plan:  B12 deficiency Assessment & Plan: Pending further lab studies; advised OTC multivitamin which includes b12.   Orders: -     Intrinsic Factor Antibodies; Future -     Homocysteine; Future -     Methylmalonic acid, serum; Future -     Celiac Disease Ab Screen w/Rfx; Future  Hyperlipidemia, unspecified hyperlipidemia type Assessment & Plan: The 10-year ASCVD risk score (Rachel Hinton DK, et al., 2019) is: 5.3% Discussed dietary measures including limiting trans, saturated fats, processed food and fried foods.  Discussed patient's  mother's history of heart attack in her 48s.  Advised we will continue to monitor cholesterol       Return precautions given.   Risks, benefits, and alternatives of the medications and treatment plan prescribed today were discussed, and patient expressed understanding.   Education regarding symptom management and diagnosis given to patient on AVS either electronically or printed.  Return in about 6 months (around 03/09/2023).  Mable Paris, FNP  Subjective:    Patient ID: Rachel Hinton, female    DOB: 11/27/1965, 57 y.o.   MRN: [id number]  CC: Rachel Hinton is a 57 y.o. female who presents today for follow up and review of labs.   HPI: She is feeling well today No new complaints  She is feeling better now that she has started b vitamin complex supplement.      Prediabetes  B12 deficiency LDL 116 Never smoker.  Family history of heart disease  due mammogram Allergies: Patient has no known allergies. Current Outpatient Medications on File Prior to Visit  Medication Sig Dispense Refill   erythromycin ophthalmic ointment Use one half inch four times daily to affected eye (s) x 7 days. 3.5 g 0   ibuprofen (ADVIL) 600 MG tablet Take 1 tablet (600 mg total) by mouth every 6 (six) hours as needed. 30 tablet 0   neomycin-polymyxin b-dexamethasone (MAXITROL) 3.5-10000-0.1 OINT SMARTSIG:1 Sparingly In Eye(s) Every Night      neomycin-polymyxin b-dexamethasone (MAXITROL) 3.5-10000-0.1 SUSP 1 drop 3 (three) times daily.     No current facility-administered medications on file prior to visit.    Review of Systems  Constitutional:  Negative for chills and fever.  Respiratory:  Negative for cough.   Cardiovascular:  Negative for chest pain and palpitations.  Gastrointestinal:  Negative for nausea and vomiting.      Objective:    BP 124/72   Pulse 86   Temp 98 F (36.7 C) (Oral)   Ht '5\' 3"'$  (1.6 m)   Wt 166 lb 3.2 oz (75.4 kg)   LMP 07/13/2014   SpO2 98%   BMI 29.44 kg/m  BP Readings from Last 3 Encounters:  09/06/22 124/72  08/23/22 120/78  08/18/22 (!) 151/77   Wt Readings from Last 3 Encounters:  09/06/22 166 lb 3.2 oz (75.4 kg)  08/23/22 168 lb (76.2 kg)  08/18/22 165 lb (74.8 kg)    Physical Exam Vitals reviewed.  Constitutional:      Appearance: She is well-developed.  Eyes:     Conjunctiva/sclera: Conjunctivae normal.  Cardiovascular:     Rate and Rhythm: Normal rate and regular rhythm.     Pulses: Normal pulses.     Heart sounds: Normal heart sounds.  Pulmonary:     Effort: Pulmonary effort is normal.     Breath sounds: Normal breath sounds. No wheezing, rhonchi or rales.  Skin:    General: Skin is warm and dry.  Neurological:     Mental Status:  She is alert.  Psychiatric:        Speech: Speech normal.        Behavior: Behavior normal.        Thought Content: Thought content normal.

## 2022-09-06 NOTE — Patient Instructions (Addendum)
You can start daily women's vitamin that includes b12 as you b12 is low end of normal.   Please follow diet and low in trans saturated fats.  Please avoid processed and fried foods;  we will continue monitor cholesterol

## 2022-09-06 NOTE — Assessment & Plan Note (Addendum)
The 10-year ASCVD risk score (Rachel Hinton DK, et al., 2019) is: 5.3% Discussed dietary measures including limiting trans, saturated fats, processed food and fried foods.  Discussed patient's  mother's history of heart attack in her 74s.  Advised we will continue to monitor cholesterol

## 2022-09-06 NOTE — Telephone Encounter (Signed)
All those test are for [facility], & can not be added. Pt will need to be recollected

## 2022-09-07 NOTE — Telephone Encounter (Signed)
LVM to call back to schedule non fasting labs in 2 weeks

## 2022-09-09 NOTE — Telephone Encounter (Signed)
Pt has appt scheduled for 09/14/22

## 2022-09-14 ENCOUNTER — Other Ambulatory Visit: Payer: No Typology Code available for payment source

## 2022-09-16 ENCOUNTER — Other Ambulatory Visit (INDEPENDENT_AMBULATORY_CARE_PROVIDER_SITE_OTHER): Payer: No Typology Code available for payment source

## 2022-09-16 DIAGNOSIS — E538 Deficiency of other specified B group vitamins: Secondary | ICD-10-CM | POA: Diagnosis not present

## 2022-09-18 LAB — CELIAC DISEASE AB SCREEN W/RFX
Antigliadin Abs, IgA: 10 units (ref 0–19)
IgA/Immunoglobulin A, Serum: 214 mg/dL (ref 87–352)
Transglutaminase IgA: <2 U/mL (ref 0–3)

## 2022-09-19 LAB — METHYLMALONIC ACID, SERUM: Methylmalonic Acid, Quant: 159 nmol/L (ref 87–318)

## 2022-09-19 LAB — INTRINSIC FACTOR ANTIBODIES: Intrinsic Factor: NEGATIVE

## 2022-09-19 LAB — HOMOCYSTEINE: Homocysteine: 8.5 umol/L (ref <10.4)

## 2022-09-28 ENCOUNTER — Telehealth: Payer: Self-pay

## 2022-09-28 NOTE — Telephone Encounter (Signed)
LVM to call back to go over results 

## 2022-11-10 ENCOUNTER — Ambulatory Visit
Admission: RE | Admit: 2022-11-10 | Discharge: 2022-11-10 | Disposition: A | Discharge status: Home / Self Care | Payer: No Typology Code available for payment source | Source: Ambulatory Visit | Attending: Nurse Practitioner | Admitting: Nurse Practitioner

## 2022-11-10 DIAGNOSIS — Z1231 Encounter for screening mammogram for malignant neoplasm of breast: Secondary | ICD-10-CM | POA: Diagnosis present

## 2022-11-11 ENCOUNTER — Other Ambulatory Visit: Payer: Self-pay | Admitting: Nurse Practitioner

## 2022-11-11 DIAGNOSIS — R928 Other abnormal and inconclusive findings on diagnostic imaging of breast: Secondary | ICD-10-CM

## 2022-11-11 DIAGNOSIS — R921 Mammographic calcification found on diagnostic imaging of breast: Secondary | ICD-10-CM

## 2022-11-12 ENCOUNTER — Other Ambulatory Visit: Payer: Self-pay | Admitting: Nurse Practitioner

## 2022-11-12 DIAGNOSIS — R921 Mammographic calcification found on diagnostic imaging of breast: Secondary | ICD-10-CM

## 2022-11-19 ENCOUNTER — Ambulatory Visit
Admission: RE | Admit: 2022-11-19 | Discharge: 2022-11-19 | Disposition: A | Discharge status: Home / Self Care | Payer: No Typology Code available for payment source | Source: Ambulatory Visit | Attending: Nurse Practitioner | Admitting: Nurse Practitioner

## 2022-11-19 DIAGNOSIS — R921 Mammographic calcification found on diagnostic imaging of breast: Secondary | ICD-10-CM | POA: Diagnosis present

## 2022-11-19 DIAGNOSIS — R928 Other abnormal and inconclusive findings on diagnostic imaging of breast: Secondary | ICD-10-CM | POA: Insufficient documentation

## 2022-11-21 NOTE — Progress Notes (Signed)
Please inform patient: The results show likely benign (noncancerous) calcification. Will repeat mammogram in 6 months.

## 2022-11-29 ENCOUNTER — Other Ambulatory Visit: Payer: Self-pay | Admitting: Family

## 2022-11-29 DIAGNOSIS — R928 Other abnormal and inconclusive findings on diagnostic imaging of breast: Secondary | ICD-10-CM

## 2023-02-10 ENCOUNTER — Encounter (INDEPENDENT_AMBULATORY_CARE_PROVIDER_SITE_OTHER): Payer: Self-pay

## 2023-03-09 ENCOUNTER — Ambulatory Visit: Payer: No Typology Code available for payment source | Admitting: Family

## 2023-03-23 ENCOUNTER — Ambulatory Visit: Payer: No Typology Code available for payment source | Admitting: Family

## 2023-03-23 ENCOUNTER — Encounter: Payer: Self-pay | Admitting: Family

## 2023-03-23 VITALS — BP 132/84 | HR 65 | Temp 98.0°F | Resp 17 | Ht 63.0 in | Wt 168.2 lb

## 2023-03-23 DIAGNOSIS — I10 Essential (primary) hypertension: Secondary | ICD-10-CM

## 2023-03-23 DIAGNOSIS — F419 Anxiety disorder, unspecified: Secondary | ICD-10-CM

## 2023-03-23 DIAGNOSIS — R7303 Prediabetes: Secondary | ICD-10-CM | POA: Diagnosis not present

## 2023-03-23 DIAGNOSIS — Z8249 Family history of ischemic heart disease and other diseases of the circulatory system: Secondary | ICD-10-CM

## 2023-03-23 DIAGNOSIS — R0789 Other chest pain: Secondary | ICD-10-CM

## 2023-03-23 DIAGNOSIS — E785 Hyperlipidemia, unspecified: Secondary | ICD-10-CM | POA: Diagnosis not present

## 2023-03-23 DIAGNOSIS — Z23 Encounter for immunization: Secondary | ICD-10-CM

## 2023-03-23 LAB — CBC WITH DIFFERENTIAL/PLATELET
Basophils Absolute: 0 10*3/uL (ref 0.0–0.1)
Basophils Relative: 0.5 % (ref 0.0–3.0)
Eosinophils Absolute: 0.2 10*3/uL (ref 0.0–0.7)
Eosinophils Relative: 2.2 % (ref 0.0–5.0)
HCT: 39.7 % (ref 36.0–46.0)
Hemoglobin: 13 g/dL (ref 12.0–15.0)
Lymphocytes Relative: 29.2 % (ref 12.0–46.0)
Lymphs Abs: 2.2 10*3/uL (ref 0.7–4.0)
MCHC: 32.7 g/dL (ref 30.0–36.0)
MCV: 89.2 fl (ref 78.0–100.0)
Monocytes Absolute: 0.4 10*3/uL (ref 0.1–1.0)
Monocytes Relative: 5 % (ref 3.0–12.0)
Neutro Abs: 4.8 10*3/uL (ref 1.4–7.7)
Neutrophils Relative %: 63.1 % (ref 43.0–77.0)
Platelets: 211 10*3/uL (ref 150.0–400.0)
RBC: 4.45 Mil/uL (ref 3.87–5.11)
RDW: 13.8 % (ref 11.5–15.5)
WBC: 7.6 10*3/uL (ref 4.0–10.5)

## 2023-03-23 LAB — LIPID PANEL
Cholesterol: 226 mg/dL — ABNORMAL HIGH (ref 0–200)
HDL: 45.5 mg/dL (ref >39.00)
LDL Cholesterol: 121 mg/dL — ABNORMAL HIGH (ref 0–99)
NonHDL: 180.21
Total CHOL/HDL Ratio: 5
Triglycerides: 294 mg/dL — ABNORMAL HIGH (ref 0.0–149.0)
VLDL: 58.8 mg/dL — ABNORMAL HIGH (ref 0.0–40.0)

## 2023-03-23 LAB — COMPREHENSIVE METABOLIC PANEL
ALT: 13 U/L (ref 0–35)
AST: 16 U/L (ref 0–37)
Albumin: 4 g/dL (ref 3.5–5.2)
Alkaline Phosphatase: 82 U/L (ref 39–117)
BUN: 12 mg/dL (ref 6–23)
CO2: 28 mEq/L (ref 19–32)
Calcium: 8.8 mg/dL (ref 8.4–10.5)
Chloride: 106 mEq/L (ref 96–112)
Creatinine, Ser: 0.66 mg/dL (ref 0.40–1.20)
GFR: 97.31 mL/min (ref >60.00)
Glucose, Bld: 106 mg/dL — ABNORMAL HIGH (ref 70–99)
Potassium: 3.7 mEq/L (ref 3.5–5.1)
Sodium: 140 mEq/L (ref 135–145)
Total Bilirubin: 0.3 mg/dL (ref 0.2–1.2)
Total Protein: 6.7 g/dL (ref 6.0–8.3)

## 2023-03-23 LAB — HEMOGLOBIN A1C: Hgb A1c MFr Bld: 5.8 % (ref 4.6–6.5)

## 2023-03-23 NOTE — Progress Notes (Signed)
Assessment & Plan:  Family history of ASCVD -     EKG 12-Lead -     Ambulatory referral to Cardiology -     ECHOCARDIOGRAM COMPLETE; Future  Hypertension, unspecified type -     CBC with Differential/Platelet -     Comprehensive metabolic panel -     Ambulatory referral to Cardiology -     ECHOCARDIOGRAM COMPLETE; Future  Hyperlipidemia, unspecified hyperlipidemia type -     Lipid panel  Prediabetes -     CBC with Differential/Platelet -     Hemoglobin A1c  Need for influenza vaccination -     Flu vaccine trivalent PF, 6mos and older(Flulaval,Afluria,Fluarix,Fluzone)  Anxiety Assessment & Plan: Particularly related to family stress at this time.  Patient and I discussed counseling and anxiety medication and she very politely declines at this time.  She will let me know if her situation were to change.   Atypical chest pain Assessment & Plan: Atypical atypical chest pain as not occurring with exertion nor is it described as necessarily "pain".  EKG showing NSR today without acute changes when compared to prior 08/18/22. Question left atrial enlargement.  Pending echocardiogram and have also placed a referral to cardiology for discussion of ischemic testing.      Return precautions given.   Risks, benefits, and alternatives of the medications and treatment plan prescribed today were discussed, and patient expressed understanding.   Education regarding symptom management and diagnosis given to patient on AVS either electronically or printed.  Return in about 2 months (around 05/23/2023).  Rennie Plowman, FNP  Subjective:    Patient ID: Rachel Hinton, female    DOB: 28-Jan-1966, 57 y.o.   MRN: [id number]  CC: Rachel Hinton is a 57 y.o. female who presents today for follow up.   HPI: She complains of increased of anxiety as it relates to son and his recent sexual orientation. She has worried about him and his mental health. She is tearful today.   She  is also  concerned to families' history of heart disease.   She describes episodes of  watching TV in the evening , and she may feel 'once in a while' left sided chest sensation. She states it is not an ache, pressure.  It may happen 3 times/week and it not a/w exertion.    Denies associated sob, CP, numbness of left arm or jaw, epigastric burning, cough, wheezing.     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 She has been walking every  other day for 30 -45 minutes without CP, SOB   Mammogram is scheduled   Allergies: Patient has no known allergies. Current Outpatient Medications on File Prior to Visit  Medication Sig Dispense Refill   neomycin-polymyxin b-dexamethasone (MAXITROL) 3.5-10000-0.1 SUSP 1 drop 3 (three) times daily. (Patient not taking: Reported on 03/23/2023)     No current facility-administered medications on file prior to visit.    Review of Systems  Constitutional:  Negative for chills and fever.  Respiratory:  Negative for cough and shortness of breath.   Cardiovascular:  Positive for chest pain. Negative for palpitations and leg swelling.  Gastrointestinal:  Negative for nausea and vomiting.  Psychiatric/Behavioral:  Negative for sleep disturbance. The patient is nervous/anxious.       Objective:    BP 132/84   Pulse 65   Temp 98 F (36.7 C) (Oral)   Resp 17   Ht 5\' 3"  (1.6 m)   Wt 168 lb 4 oz (76.3 kg)   LMP 07/13/2014  SpO2 98%   BMI 29.80 kg/m  BP Readings from Last 3 Encounters:  03/23/23 132/84  09/06/22 124/72  08/23/22 120/78   Wt Readings from Last 3 Encounters:  03/23/23 168 lb 4 oz (76.3 kg)  09/06/22 166 lb 3.2 oz (75.4 kg)  08/23/22 168 lb (76.2 kg)    Physical Exam Vitals reviewed.  Constitutional:      Appearance: She is well-developed.  Eyes:     Conjunctiva/sclera: Conjunctivae normal.  Cardiovascular:     Rate and Rhythm: Normal rate and regular rhythm.     Pulses: Normal pulses.     Heart sounds: Normal heart sounds.  Pulmonary:     Effort:  Pulmonary effort is normal.     Breath sounds: Normal breath sounds. No wheezing, rhonchi or rales.  Skin:    General: Skin is warm and dry.  Neurological:     Mental Status: She is alert.  Psychiatric:        Speech: Speech normal.        Behavior: Behavior normal.        Thought Content: Thought content normal.

## 2023-03-23 NOTE — Assessment & Plan Note (Signed)
Particularly related to family stress at this time.  Patient and I discussed counseling and anxiety medication and she very politely declines at this time.  She will let me know if her situation were to change.

## 2023-03-23 NOTE — Assessment & Plan Note (Addendum)
Atypical atypical chest pain as not occurring with exertion nor is it described as necessarily "pain".  EKG showing NSR today without acute changes when compared to prior 08/18/22. Question left atrial enlargement.  Pending echocardiogram and have also placed a referral to cardiology for discussion of ischemic testing.

## 2023-03-23 NOTE — Patient Instructions (Addendum)
I have ordered an ultrasound of your heart and also placed a referral to cardiology  Let us know if you dont hear back within a week in regards to an appointment being scheduled.   So that you are aware, if you are [facility] MyChart user , please pay attention to your MyChart messages as you may receive a MyChart message with a phone number to call and schedule this test/appointment own your own from our referral coordinator. This is a new process so I do not want you to miss this message.  If you are not a MyChart user, you will receive a phone call.   Blood pressure is slightly elevated today.  I would like for you to keep a log at home as previously well-controlled.  If persistently greater than 130/80, please call the office to let me know.    Please let me know how you are feeling and how I can support with your family stress.

## 2023-04-04 ENCOUNTER — Telehealth: Payer: Self-pay

## 2023-04-04 NOTE — Telephone Encounter (Signed)
LMTCB

## 2023-04-04 NOTE — Telephone Encounter (Signed)
-----   Message from Rennie Plowman sent at 03/31/2023 11:49 AM EDT ----- Call patient Patient has not viewed MyChart result note.  Please review my chart note in detail with patient.   Please let me know if questions

## 2023-05-23 ENCOUNTER — Encounter: Payer: Self-pay | Admitting: Family

## 2023-05-23 ENCOUNTER — Ambulatory Visit: Payer: No Typology Code available for payment source | Admitting: Family

## 2023-05-23 VITALS — BP 130/78 | HR 60 | Temp 97.8°F | Ht 63.0 in | Wt 168.0 lb

## 2023-05-23 DIAGNOSIS — R0789 Other chest pain: Secondary | ICD-10-CM | POA: Diagnosis not present

## 2023-05-23 DIAGNOSIS — F419 Anxiety disorder, unspecified: Secondary | ICD-10-CM

## 2023-05-23 NOTE — Assessment & Plan Note (Signed)
Chronic, stable at this time.  She is participating in family counseling . will continue to monitor.

## 2023-05-23 NOTE — Progress Notes (Signed)
Assessment & Plan:  Anxiety Assessment & Plan: Chronic, stable at this time.  She is participating in family counseling . will continue to monitor.   Atypical chest pain Assessment & Plan: No recurrence.  Cardiology appointment scheduled.  Working on scheduling echocardiogram      Return precautions given.   Risks, benefits, and alternatives of the medications and treatment plan prescribed today were discussed, and patient expressed understanding.   Education regarding symptom management and diagnosis given to patient on AVS either electronically or printed.  Return in about 4 months (around 09/20/2023).  Rennie Plowman, FNP  Subjective:    Patient ID: Rachel Hinton, female    DOB: [birth year], 57 y.o.   MRN: [id number]  CC: Anna Grenfell is a 57 y.o. female who presents today for follow up.   HPI: Feels well today.  No new complaints  No recurrence of chest pain. She has an appointment with cardiologist next month.      Sleeping well She does worry about her son.  She is in family counseling which she is found helpful.  She does not feel that she needs medication at this time for anxiety     Allergies: Patient has no known allergies. Current Outpatient Medications on File Prior to Visit  Medication Sig Dispense Refill   neomycin-polymyxin b-dexamethasone (MAXITROL) 3.5-10000-0.1 SUSP 1 drop 3 (three) times daily. (Patient not taking: Reported on 03/23/2023)     No current facility-administered medications on file prior to visit.    Review of Systems  Constitutional:  Negative for chills and fever.  Respiratory:  Negative for cough.   Cardiovascular:  Negative for chest pain and palpitations.  Gastrointestinal:  Negative for nausea and vomiting.  Psychiatric/Behavioral:  Negative for sleep disturbance and suicidal ideas. The patient is nervous/anxious.       Objective:    BP 130/78   Pulse 60   Temp 97.8 F (36.6 C) (Oral)   Ht 5\' 3"  (1.6 m)   Wt  168 lb (76.2 kg)   LMP 07/13/2014   SpO2 97%   BMI 29.76 kg/m  BP Readings from Last 3 Encounters:  05/23/23 130/78  03/23/23 132/84  09/06/22 124/72   Wt Readings from Last 3 Encounters:  05/23/23 168 lb (76.2 kg)  03/23/23 168 lb 4 oz (76.3 kg)  09/06/22 166 lb 3.2 oz (75.4 kg)      03/23/2023   11:43 AM 09/06/2022    3:33 PM 08/23/2022   11:47 AM  Depression screen PHQ 2/9  Decreased Interest 1 0 0  Down, Depressed, Hopeless 0 0 0  PHQ - 2 Score 1 0 0  Altered sleeping 0 2   Tired, decreased energy 1 2   Change in appetite 0 0   Feeling bad or failure about yourself  0 0   Trouble concentrating 0 0   Moving slowly or fidgety/restless 0 0   Suicidal thoughts 0 0   PHQ-9 Score 2 4   Difficult doing work/chores Somewhat difficult Not difficult at all      Physical Exam Vitals reviewed.  Constitutional:      Appearance: She is well-developed.  Eyes:     Conjunctiva/sclera: Conjunctivae normal.  Cardiovascular:     Rate and Rhythm: Normal rate and regular rhythm.     Pulses: Normal pulses.     Heart sounds: Normal heart sounds.  Pulmonary:     Effort: Pulmonary effort is normal.     Breath sounds: Normal breath sounds. No wheezing,  rhonchi or rales.  Skin:    General: Skin is warm and dry.  Neurological:     Mental Status: She is alert.  Psychiatric:        Speech: Speech normal.        Behavior: Behavior normal.        Thought Content: Thought content normal.

## 2023-05-23 NOTE — Assessment & Plan Note (Signed)
No recurrence.  Cardiology appointment scheduled.  Working on scheduling echocardiogram

## 2023-06-02 ENCOUNTER — Ambulatory Visit
Admission: RE | Admit: 2023-06-02 | Discharge: 2023-06-02 | Disposition: A | Discharge status: Home / Self Care | Payer: No Typology Code available for payment source | Source: Ambulatory Visit | Attending: Family | Admitting: Family

## 2023-06-02 DIAGNOSIS — R928 Other abnormal and inconclusive findings on diagnostic imaging of breast: Secondary | ICD-10-CM | POA: Insufficient documentation

## 2023-06-06 NOTE — Progress Notes (Unsigned)
Cardiology Office Note  Date:  06/06/2023   ID:  Rachel Hinton, DOB [birth year], MRN [id number]  PCP:  Allegra Grana, FNP   No chief complaint on file.   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  PSH:    Past Surgical History:  Procedure Laterality Date   CESAREAN SECTION     COLONOSCOPY  12/20/2019   SHOULDER ARTHROSCOPY WITH DISTAL CLAVICLE RESECTION Right 07/19/2016   Procedure: SHOULDER ARTHROSCOPY, DEBRIDEMENT PARTIAL ROTATOR CUFF TEAR, SUBACROMIAL DECOMPRESSION, DISTAL CLAVICAL EXCISION;  Surgeon: Jones Broom, MD;  Location: [facility];  Service: Orthopedics;  Laterality: Right;   SUBACROMIAL DECOMPRESSION Right 07/19/2016   Procedure: SUBACROMIAL DECOMPRESSION;  Surgeon: Jones Broom, MD;  Location:  SURGERY CENTER;  Service: Orthopedics;  Laterality: Right;    Current Outpatient Medications  Medication Sig Dispense Refill   neomycin-polymyxin b-dexamethasone (MAXITROL) 3.5-10000-0.1 SUSP 1 drop 3 (three) times daily. (Patient not taking: Reported on 03/23/2023)     No current facility-administered medications for this visit.     Allergies:   Patient has no known allergies.   Social History:  The patient  reports that she has never smoked. She has never used smokeless tobacco. She reports that she does not drink alcohol and does not use drugs.   Family History:   family history includes Heart attack (age of onset: 51) in her brother; Heart attack (age of onset: 20) in her father; Heart disease (age of onset: 47) in her mother; Hypertension in her mother; Prostate cancer in her paternal uncle.    Review of Systems: ROS   PHYSICAL EXAM: VS:  LMP  07/13/2014  , BMI There is no height or weight on file to calculate BMI. GEN: Well nourished, well developed, in no acute distress HEENT: normal Neck: no JVD, carotid bruits, or masses Cardiac: RRR; no murmurs, rubs, or gallops,no edema  Respiratory:  clear to auscultation bilaterally, normal work of breathing GI: soft, nontender, nondistended, + BS MS: no deformity or atrophy Skin: warm and dry, no rash Neuro:  Strength and sensation are intact Psych: euthymic mood, full affect    Recent Labs: 03/23/2023: ALT 13; BUN 12; Creatinine, Ser 0.66; Hemoglobin 13.0; Platelets 211.0; Potassium 3.7; Sodium 140    Lipid Panel Lab Results  Component Value Date   CHOL 226 (H) 03/23/2023   HDL 45.50 03/23/2023   LDLCALC 121 (H) 03/23/2023   TRIG 294.0 (H) 03/23/2023      Wt Readings from Last 3 Encounters:  05/23/23 168 lb (76.2 kg)  03/23/23 168 lb 4 oz (76.3 kg)  09/06/22 166 lb 3.2 oz (75.4 kg)       ASSESSMENT AND PLAN:  Problem List Items Addressed This Visit   None    Disposition:   F/U  12 months   Total encounter time more than 30 minutes  Greater than 50% was spent in counseling and coordination of care with the patient    Signed, Dossie Arbour, M.D., Ph.D. [facility] [place], [place] [phone]

## 2023-06-07 ENCOUNTER — Ambulatory Visit
Payer: No Typology Code available for payment source | Attending: Cardiovascular Disease | Admitting: Cardiovascular Disease

## 2023-06-07 ENCOUNTER — Encounter: Payer: Self-pay | Admitting: Cardiovascular Disease

## 2023-06-07 VITALS — BP 140/90 | HR 65 | Ht 63.0 in | Wt 168.5 lb

## 2023-06-07 DIAGNOSIS — F419 Anxiety disorder, unspecified: Secondary | ICD-10-CM

## 2023-06-07 DIAGNOSIS — E782 Mixed hyperlipidemia: Secondary | ICD-10-CM

## 2023-06-07 DIAGNOSIS — I209 Angina pectoris, unspecified: Secondary | ICD-10-CM

## 2023-06-07 DIAGNOSIS — R079 Chest pain, unspecified: Secondary | ICD-10-CM | POA: Diagnosis not present

## 2023-06-07 DIAGNOSIS — I10 Essential (primary) hypertension: Secondary | ICD-10-CM

## 2023-06-07 DIAGNOSIS — R002 Palpitations: Secondary | ICD-10-CM

## 2023-06-07 DIAGNOSIS — Z8249 Family history of ischemic heart disease and other diseases of the circulatory system: Secondary | ICD-10-CM

## 2023-06-07 DIAGNOSIS — R072 Precordial pain: Secondary | ICD-10-CM

## 2023-06-07 DIAGNOSIS — R0789 Other chest pain: Secondary | ICD-10-CM

## 2023-06-07 DIAGNOSIS — Z79899 Other long term (current) drug therapy: Secondary | ICD-10-CM

## 2023-06-07 MED ORDER — METOPROLOL TARTRATE 100 MG PO TABS: 100.0000 mg | ORAL_TABLET | Freq: Once | ORAL | 0 refills | Status: DC

## 2023-06-07 NOTE — Patient Instructions (Addendum)
Medication Instructions:  No changes  Metoprolol Tartrate 100 MG once 2 hours prior to Cardiac CT.  If you need a refill on your cardiac medications before your next appointment, please call your pharmacy.   Lab work: Your provider would like for you to have following labs drawn today BMP.    Testing/Procedures:   Your cardiac CT will be scheduled at one of the below locations:   [facility] [address] [place], [place] [address] [phone]  OR   [facility] [address] [place], [place] [address] [phone]   If scheduled at [facility] or [facility], please arrive 15 mins early for check-in and test prep.  There is spacious parking and easy access to the radiology department from the [facility] entrance. Please enter here and check-in with the desk attendant.   Please follow these instructions carefully (unless otherwise directed):  An IV will be required for this test and Nitroglycerin will be given.   On the Night Before the Test: Be sure to Drink plenty of water. Do not consume any caffeinated/decaffeinated beverages or chocolate 12 hours prior to your test. Do not take any antihistamines 12 hours prior to your test.   On the Day of the Test: Drink plenty of water until 1 hour prior to the test. Do not eat any food 1 hour prior to test. You may take your regular medications prior to the test.  Take metoprolol (Lopressor) 100 MG two hours prior to test. If you take Furosemide/Hydrochlorothiazide/Spironolactone/Chlorthalidone, please HOLD on the morning of the test. Patients who wear a continuous glucose monitor MUST remove the device prior to scanning. FEMALES- please wear underwire-free bra if available, avoid dresses & tight clothing       After the Test: Drink plenty of water. After receiving IV contrast, you may  experience a mild flushed feeling. This is normal. On occasion, you may experience a mild rash up to 24 hours after the test. This is not dangerous. If this occurs, you can take Benadryl 25 mg and increase your fluid intake. If you experience trouble breathing, this can be serious. If it is severe call 911 IMMEDIATELY. If it is mild, please call our office.  We will call to schedule your test 2-4 weeks out understanding that some insurance companies will need an authorization prior to the service being performed.   For more information and frequently asked questions, please visit our website : [email or web address]  For non-scheduling related questions, please contact the cardiac imaging nurse navigator should you have any questions/concerns: Cardiac Imaging Nurse Navigators Direct Office Dial: [phone]   For scheduling needs, including cancellations and rescheduling, please call [place], [phone].   Follow-Up: At [facility], you and your health needs are our priority.  As part of our continuing mission to provide you with exceptional heart care, we have created designated Provider Care Teams.  These Care Teams include your primary Cardiologist (physician) and Advanced Practice Providers (APPs -  Physician Assistants and Nurse Practitioners) who all work together to provide you with the care you need, when you need it.  You will need a follow up appointment as needed  Providers on your designated Care Team:   Nicolasa Ducking, NP Eula Listen, PA-C Cadence Fransico [place], [place]  COVID-19 Vaccine Information can be found at: [email or web address] For questions related to vaccine distribution or appointments, please email [email or web address] or call [phone].

## 2023-06-08 LAB — BASIC METABOLIC PANEL
BUN/Creatinine Ratio: 18 (ref 9–23)
BUN: 12 mg/dL (ref 6–24)
CO2: 24 mmol/L (ref 20–29)
Calcium: 9.6 mg/dL (ref 8.7–10.2)
Chloride: 103 mmol/L (ref 96–106)
Creatinine, Ser: 0.66 mg/dL (ref 0.57–1.00)
Glucose: 95 mg/dL (ref 70–99)
Potassium: 4.2 mmol/L (ref 3.5–5.2)
Sodium: 141 mmol/L (ref 134–144)
eGFR: 102 mL/min/{1.73_m2} (ref >59)

## 2023-06-13 ENCOUNTER — Telehealth: Payer: Self-pay | Admitting: Cardiovascular Disease

## 2023-06-13 ENCOUNTER — Encounter: Payer: Self-pay | Admitting: Emergency Medicine

## 2023-06-13 NOTE — Telephone Encounter (Signed)
*  STAT* If patient is at the pharmacy, call can be transferred to refill team.   1. Which medications need to be refilled? (please list name of each medication and dose if known) metoprolol tartrate (LOPRESSOR) 100 MG tablet (Expired)   2. Which pharmacy/location (including street and city if local pharmacy) is medication to be sent to?  [id number] - [place], [place] - [address]    3. Do they need a 30 day or 90 day supply? 90

## 2023-06-14 NOTE — Telephone Encounter (Signed)
This is a Estate agent.

## 2023-06-14 NOTE — Telephone Encounter (Signed)
Lmovm to verify if pt was able to pick up her one time dose for CT testing before cancelling request.

## 2023-06-15 ENCOUNTER — Telehealth (HOSPITAL_COMMUNITY): Payer: Self-pay | Admitting: *Deleted

## 2023-06-15 NOTE — Telephone Encounter (Signed)
Attempted to call patient regarding upcoming cardiac CT appointment. Left message on voicemail with name and callback number Hayley Sharpe RN Navigator Cardiac Imaging [facility] [phone] Office   

## 2023-06-16 ENCOUNTER — Ambulatory Visit
Admission: RE | Admit: 2023-06-16 | Discharge: 2023-06-16 | Disposition: A | Discharge status: Home / Self Care | Payer: No Typology Code available for payment source | Source: Ambulatory Visit | Attending: Cardiovascular Disease | Admitting: Cardiovascular Disease

## 2023-06-16 DIAGNOSIS — R072 Precordial pain: Secondary | ICD-10-CM | POA: Insufficient documentation

## 2023-06-16 MED ORDER — SODIUM CHLORIDE 0.9 % IV BOLUS
150.0000 mL | Freq: Once | INTRAVENOUS | Status: AC
  Administered 2023-06-16: 150 mL via INTRAVENOUS

## 2023-06-16 MED ORDER — IOHEXOL 350 MG/ML SOLN
75.0000 mL | Freq: Once | INTRAVENOUS | Status: AC | PRN
  Administered 2023-06-16: 75 mL via INTRAVENOUS

## 2023-06-16 MED ORDER — NITROGLYCERIN 0.4 MG SL SUBL
0.8000 mg | SUBLINGUAL_TABLET | Freq: Once | SUBLINGUAL | Status: AC
  Administered 2023-06-16: 0.8 mg via SUBLINGUAL

## 2023-06-16 NOTE — Progress Notes (Signed)
Patient tolerated CT well. Drank water after. Vital signs stable encourage to drink water throughout day.Reasons explained and verbalized understanding. Ambulated steady gait.  

## 2023-06-20 ENCOUNTER — Encounter: Payer: Self-pay | Admitting: Emergency Medicine

## 2023-09-20 ENCOUNTER — Ambulatory Visit: Payer: No Typology Code available for payment source | Admitting: Family

## 2023-09-29 ENCOUNTER — Ambulatory Visit (INDEPENDENT_AMBULATORY_CARE_PROVIDER_SITE_OTHER): Admitting: Family

## 2023-09-29 ENCOUNTER — Encounter: Payer: Self-pay | Admitting: Family

## 2023-09-29 VITALS — BP 130/82 | HR 79 | Temp 98.4°F | Ht 63.0 in | Wt 178.3 lb

## 2023-09-29 DIAGNOSIS — Z1231 Encounter for screening mammogram for malignant neoplasm of breast: Secondary | ICD-10-CM | POA: Diagnosis not present

## 2023-09-29 DIAGNOSIS — R002 Palpitations: Secondary | ICD-10-CM

## 2023-09-29 DIAGNOSIS — Z8249 Family history of ischemic heart disease and other diseases of the circulatory system: Secondary | ICD-10-CM

## 2023-09-29 NOTE — Patient Instructions (Signed)
Please let me know if vaginal bleeding persists   please call  and schedule your 3D mammogram and /or bone density scan as we discussed.   [facility]  ( new location in 2023)  [address], [place], [place] [address]  [place], [place]  [phone]   I have ordered transvaginal ultrasound.  Let us know if you dont hear back within a week in regards to an appointment being scheduled.   So that you are aware, if you are [facility] MyChart user , please pay attention to your MyChart messages as you may receive a MyChart message with a phone number to call and schedule this test/appointment own your own from our referral coordinator. This is a new process so I do not want you to miss this message.  If you are not a MyChart user, you will receive a phone call.   

## 2023-09-29 NOTE — Assessment & Plan Note (Signed)
 Reviewed CTA . Encouraged regular exercise.

## 2023-09-29 NOTE — Assessment & Plan Note (Addendum)
 Chronic, stable. Infrequent, self limiting. If were to increase in frequency, duration, order Zio.  Patient feels comfortable monitoring symptom at this time.

## 2023-09-29 NOTE — Progress Notes (Signed)
 Assessment & Plan:  Family history of ASCVD Assessment & Plan: Reviewed CTA . Encouraged regular exercise.    Encounter for screening mammogram for malignant neoplasm of breast -     3D Screening Mammogram, Left and Right; Future  Palpitations Assessment & Plan: Chronic, stable. Infrequent, self limiting. If were to increase in frequency, duration, order Zio.  Patient feels comfortable monitoring symptom at this time.       Return precautions given.   Risks, benefits, and alternatives of the medications and treatment plan prescribed today were discussed, and patient expressed understanding.   Education regarding symptom management and diagnosis given to patient on AVS either electronically or printed.  Return in about 6 months (around 03/30/2024).  Rennie Plowman, FNP  Subjective:    Patient ID: Rachel Hinton, female    DOB: 1965/07/25, 58 y.o.   MRN: [id number]  CC: Rachel Hinton is a 58 y.o. female who presents today for follow up.   HPI: She is feeling well today No new concerns  Stress is less than prior and she has started a role in environmental service at [employer] Episodic palpitations which less frequent, 'lasts for seconds'. May occur in the morning while watching TV for example. She thinks related to stress.  No caffeine.   She is considering going back to school for CNA and she is looking forward to this.   Denies associated syncope, dizziness, CP, sob, leg swelling  No formal exercise. She has gained weight in the last 3 months since she stopped working as Engineer, structural at [employer]. Plans to start walking      Follow-up cardiology Dr. Mariah Milling 06/07/2023 06/16/23 CTA with calcium score 0. No significant CAD.  Screening mammogram due May 2025 Allergies: Patient has no known allergies. No current outpatient medications on file prior to visit.   No current facility-administered medications on file prior to visit.    Review of Systems  Constitutional:   Negative for chills and fever.  Respiratory:  Negative for cough and shortness of breath.   Cardiovascular:  Positive for palpitations. Negative for chest pain.  Gastrointestinal:  Negative for nausea and vomiting.  Psychiatric/Behavioral:  The patient is nervous/anxious.       Objective:    BP 130/82   Pulse 79   Temp 98.4 F (36.9 C) (Oral)   Ht 5\' 3"  (1.6 m)   Wt 178 lb 4.8 oz (80.9 kg)   LMP 07/13/2014   SpO2 98%   BMI 31.58 kg/m  BP Readings from Last 3 Encounters:  09/29/23 130/82  06/16/23 108/68  06/07/23 (!) 140/90   Wt Readings from Last 3 Encounters:  09/29/23 178 lb 4.8 oz (80.9 kg)  06/07/23 168 lb 8 oz (76.4 kg)  05/23/23 168 lb (76.2 kg)      09/29/2023    9:38 AM 03/23/2023   11:43 AM 09/06/2022    3:33 PM  Depression screen PHQ 2/9  Decreased Interest 0 1 0  Down, Depressed, Hopeless 0 0 0  PHQ - 2 Score 0 1 0  Altered sleeping  0 2  Tired, decreased energy  1 2  Change in appetite  0 0  Feeling bad or failure about yourself   0 0  Trouble concentrating  0 0  Moving slowly or fidgety/restless  0 0  Suicidal thoughts  0 0  PHQ-9 Score  2 4  Difficult doing work/chores  Somewhat difficult Not difficult at all     Physical Exam Vitals reviewed.  Constitutional:  Appearance: She is well-developed.  Eyes:     Conjunctiva/sclera: Conjunctivae normal.  Cardiovascular:     Rate and Rhythm: Normal rate and regular rhythm.     Pulses: Normal pulses.     Heart sounds: Normal heart sounds.  Pulmonary:     Effort: Pulmonary effort is normal.     Breath sounds: Normal breath sounds. No wheezing, rhonchi or rales.  Skin:    General: Skin is warm and dry.  Neurological:     Mental Status: She is alert.  Psychiatric:        Speech: Speech normal.        Behavior: Behavior normal.        Thought Content: Thought content normal.

## 2023-11-04 ENCOUNTER — Encounter (HOSPITAL_COMMUNITY): Payer: Self-pay

## 2023-11-11 ENCOUNTER — Ambulatory Visit
Admission: RE | Admit: 2023-11-11 | Discharge: 2023-11-11 | Disposition: A | Discharge status: Home / Self Care | Source: Ambulatory Visit | Attending: Family | Admitting: Family

## 2023-11-11 DIAGNOSIS — Z1231 Encounter for screening mammogram for malignant neoplasm of breast: Secondary | ICD-10-CM | POA: Diagnosis present

## 2023-11-16 ENCOUNTER — Other Ambulatory Visit: Payer: Self-pay | Admitting: Family

## 2023-11-16 DIAGNOSIS — R928 Other abnormal and inconclusive findings on diagnostic imaging of breast: Secondary | ICD-10-CM

## 2023-11-17 ENCOUNTER — Ambulatory Visit: Payer: Self-pay | Admitting: Family

## 2023-11-22 ENCOUNTER — Other Ambulatory Visit

## 2023-11-22 ENCOUNTER — Inpatient Hospital Stay: Admission: RE | Admit: 2023-11-22 | Source: Ambulatory Visit

## 2023-11-25 ENCOUNTER — Ambulatory Visit
Admission: RE | Admit: 2023-11-25 | Discharge: 2023-11-25 | Disposition: A | Discharge status: Home / Self Care | Source: Ambulatory Visit | Attending: Family | Admitting: Family

## 2023-11-25 DIAGNOSIS — R928 Other abnormal and inconclusive findings on diagnostic imaging of breast: Secondary | ICD-10-CM | POA: Diagnosis present
